# Patient Record
Sex: Female | Born: 2000 | Race: White | Hispanic: No | Marital: Married | Smoking: Never smoker
Health system: Southern US, Community
[De-identification: ages and names within clinical notes are randomized; demographics above are authoritative.]

## PROBLEM LIST (undated history)

## (undated) DIAGNOSIS — G43009 Migraine without aura, not intractable, without status migrainosus: Secondary | ICD-10-CM

## (undated) DIAGNOSIS — R519 Headache, unspecified: Secondary | ICD-10-CM

## (undated) DIAGNOSIS — Z8249 Family history of ischemic heart disease and other diseases of the circulatory system: Secondary | ICD-10-CM

## (undated) DIAGNOSIS — Z87448 Personal history of other diseases of urinary system: Secondary | ICD-10-CM

## (undated) DIAGNOSIS — Z8679 Personal history of other diseases of the circulatory system: Secondary | ICD-10-CM

## (undated) DIAGNOSIS — R251 Tremor, unspecified: Secondary | ICD-10-CM

## (undated) DIAGNOSIS — K831 Obstruction of bile duct: Secondary | ICD-10-CM

## (undated) DIAGNOSIS — Z87898 Personal history of other specified conditions: Secondary | ICD-10-CM

## (undated) DIAGNOSIS — N750 Cyst of Bartholin's gland: Secondary | ICD-10-CM

## (undated) DIAGNOSIS — Z8489 Family history of other specified conditions: Secondary | ICD-10-CM

## (undated) DIAGNOSIS — R51 Headache: Secondary | ICD-10-CM

## (undated) HISTORY — DX: Obstruction of bile duct: K83.1

## (undated) HISTORY — PX: HERNIA REPAIR: SHX51

## (undated) HISTORY — PX: TYMPANOSTOMY TUBE PLACEMENT: SHX32

## (undated) HISTORY — DX: Personal history of other diseases of the circulatory system: Z86.79

## (undated) HISTORY — DX: Family history of ischemic heart disease and other diseases of the circulatory system: Z82.49

## (undated) HISTORY — DX: Family history of other specified conditions: Z84.89

## (undated) HISTORY — DX: Personal history of other specified conditions: Z87.898

## (undated) HISTORY — DX: Migraine without aura, not intractable, without status migrainosus: G43.009

## (undated) HISTORY — DX: Personal history of other diseases of urinary system: Z87.448

## (undated) HISTORY — DX: Cyst of Bartholin's gland: N75.0

## (undated) HISTORY — PX: FOOT SURGERY: SHX648

---
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
## 2019-01-01 ENCOUNTER — Ambulatory Visit (INDEPENDENT_AMBULATORY_CARE_PROVIDER_SITE_OTHER): Payer: BLUE CROSS/BLUE SHIELD | Admitting: Family Medicine

## 2019-01-01 ENCOUNTER — Encounter: Payer: Self-pay | Admitting: Family Medicine

## 2019-01-01 ENCOUNTER — Other Ambulatory Visit: Payer: Self-pay

## 2019-01-01 VITALS — BP 100/62 | HR 86 | Temp 98.2°F | Ht 62.0 in | Wt 108.8 lb

## 2019-01-01 DIAGNOSIS — N760 Acute vaginitis: Secondary | ICD-10-CM | POA: Insufficient documentation

## 2019-01-01 LAB — POCT WET PREP (WET MOUNT): Trichomonas Wet Prep HPF POC: ABSENT

## 2019-01-01 MED ORDER — METRONIDAZOLE 0.75 % VA GEL: 1.0000 | Freq: Every day | VAGINAL | 0 refills | Status: DC

## 2019-01-01 MED ORDER — FLUCONAZOLE 150 MG PO TABS: 150.0000 mg | ORAL_TABLET | Freq: Once | ORAL | 0 refills | Status: AC

## 2019-01-01 NOTE — Assessment & Plan Note (Signed)
Improved after 1 pill diflucan pill but not resolved (this started after taking abx for uti)  Wet prep shows clue cells and scant yeast Will tx with one more diflucan pill orally  For BV-will try metrogel vaginal qhs for 7d  Abstain from sexual activity until treated  Update if not starting to improve in a week or if worsening   Meds ordered this encounter  Medications  . fluconazole (DIFLUCAN) 150 MG tablet    Sig: Take 1 tablet (150 mg total) by mouth once for 1 dose.    Dispense:  1 tablet    Refill:  0  . metroNIDAZOLE (METROGEL VAGINAL) 0.75 % vaginal gel    Sig: Place 1 Applicatorful vaginally at bedtime.    Dispense:  70 g    Refill:  0

## 2019-01-01 NOTE — Patient Instructions (Signed)
Take one more diflucan Use the metro gel vaginal treatment each bedtime for a week  Then please call if no improvement in your symptoms

## 2019-01-01 NOTE — Progress Notes (Signed)
Subjective:    Patient ID: Sydney Dunn, female    DOB: August 20, 2001, 18 y.o.   MRN: [id number]  HPI  Here for vaginal symptoms (after finishing keflex)  White vaginal d/c and itching Dysuria   Had e visit 4/10  For uti and was tx with keflex  Then another e visit on 4/18 for yeast inf-tx with diflucan   For several days after that she was worse -now improved Still has light d/c- still white  Still has vaginal itching  The urinary burning is not there   No otc meds   She had some cramping- but expecting period soon   No concerns for STDs at all   She takes sprintec for contraception  No missed menses   Results for orders placed or performed in visit on 01/01/19  POCT Wet Prep [facility])  Result Value Ref Range   Source Wet Prep POC vag    WBC, Wet Prep HPF POC few    Bacteria Wet Prep HPF POC Few Few   BACTERIA WET PREP MORPHOLOGY POC     Clue Cells Wet Prep HPF POC Moderate (A) None   Clue Cells Wet Prep Whiff POC     Yeast Wet Prep HPF POC Few (A) None   KOH Wet Prep POC None None   Trichomonas Wet Prep HPF POC Absent Absent     Patient Active Problem List   Diagnosis Date Noted  . Vaginitis 01/01/2019  . Routine general medical examination at a health care facility 11/15/2018  . Irregular menses 04/13/2018  . Headache disorder 03/26/2018  . Tremor 06/16/2017  . Family history of headaches 07/13/2016  . Well adolescent visit 03/11/2014  . Hx of hernia repair 03/11/2014   Past Medical History:  Diagnosis Date  . Family history of allergies   . Headache    migraines - 2x/mo.  milder HA more often  . History of headache   . history of heart murmur   . History of hypotension    Past Surgical History:  Procedure Laterality Date  . FOOT SURGERY Left   . HERNIA REPAIR     abdominal hernia 2010 at [facility]  . TYMPANOSTOMY TUBE PLACEMENT     22 months of age   Social History   Tobacco Use  . Smoking status: Never Smoker  . Smokeless tobacco: Never Used   Substance Use Topics  . Alcohol use: No    Alcohol/week: 0.0 standard drinks  . Drug use: No   Family History  Problem Relation Age of Onset  . Breast cancer Other        paternal great grandmother  . Hyperlipidemia Maternal Grandfather   . Heart disease Maternal Grandfather   . Hypertension Maternal Grandfather   . Diabetes Maternal Grandfather   . Hypertension Mother   . Hypertension Father   . Hypertension Maternal Grandmother   . Diabetes Maternal Grandmother   . Kidney disease Other        great grandfather   No Known Allergies Current Outpatient Medications on File Prior to Visit  Medication Sig Dispense Refill  . norgestimate-ethinyl estradiol (SPRINTEC 28) 0.25-35 MG-MCG tablet Take 1 tablet by mouth daily. 3 Package 3  . SUMAtriptan (IMITREX) 25 MG tablet TAKE 1 TABLET BY MOUTH AT ONSET OF HEADACHE MAY TAKE AN ADDITIONAL TABLET EVERY 2 HOURS IF NO RELIEF. MAX OF 3 TABLETS (75MG ) EVERY 24 HOURS 12 tablet 5   No current facility-administered medications on file prior to visit.  Review of Systems  Constitutional: Negative for activity change, appetite change, fatigue, fever and unexpected weight change.  HENT: Negative for congestion, ear pain, rhinorrhea, sinus pressure and sore throat.   Eyes: Negative for pain, redness and visual disturbance.  Respiratory: Negative for cough, shortness of breath and wheezing.   Cardiovascular: Negative for chest pain and palpitations.  Gastrointestinal: Negative for abdominal pain, blood in stool, constipation and diarrhea.  Endocrine: Negative for polydipsia and polyuria.  Genitourinary: Positive for dysuria, vaginal discharge and vaginal pain. Negative for difficulty urinating, dyspareunia, frequency, genital sores, hematuria, pelvic pain and urgency.  Musculoskeletal: Negative for arthralgias, back pain and myalgias.  Skin: Negative for pallor and rash.  Allergic/Immunologic: Negative for environmental allergies.   Neurological: Negative for dizziness, syncope and headaches.  Hematological: Negative for adenopathy. Does not bruise/bleed easily.  Psychiatric/Behavioral: Negative for decreased concentration and dysphoric mood. The patient is not nervous/anxious.        Objective:   Physical Exam Constitutional:      General: She is not in acute distress.    Appearance: Normal appearance. She is normal weight. She is not ill-appearing.     Comments: Well appearing   HENT:     Head: Normocephalic and atraumatic.     Mouth/Throat:     Mouth: Mucous membranes are moist.     Pharynx: Oropharynx is clear.  Eyes:     Extraocular Movements: Extraocular movements intact.     Conjunctiva/sclera: Conjunctivae normal.     Pupils: Pupils are equal, round, and reactive to light.  Neck:     Musculoskeletal: Normal range of motion and neck supple.  Cardiovascular:     Rate and Rhythm: Normal rate.     Heart sounds: Normal heart sounds.  Pulmonary:     Effort: Pulmonary effort is normal. No respiratory distress.     Breath sounds: No wheezing or rales.  Abdominal:     General: Abdomen is flat. Bowel sounds are normal. There is no distension.     Palpations: Abdomen is soft. There is no mass.     Tenderness: There is no abdominal tenderness. There is no guarding or rebound.     Hernia: No hernia is present.     Comments: No suprapubic tenderness or fullness    Genitourinary:    Vagina: Vaginal discharge present.     Comments: Scant white vaginal d/c without odor  slt hyperemia of labia minora No lesions No tenderness   Wet prep pos for clue cells and scant yeast  Musculoskeletal:     Right lower leg: No edema.  Lymphadenopathy:     Cervical: No cervical adenopathy.  Skin:    General: Skin is warm and dry.     Findings: No rash.  Neurological:     Mental Status: She is alert.  Psychiatric:        Mood and Affect: Mood normal.           Assessment & Plan:   Problem List Items Addressed  This Visit      Genitourinary   Vaginitis - Primary    Improved after 1 pill diflucan pill but not resolved (this started after taking abx for uti)  Wet prep shows clue cells and scant yeast Will tx with one more diflucan pill orally  For BV-will try metrogel vaginal qhs for 7d  Abstain from sexual activity until treated  Update if not starting to improve in a week or if worsening   Meds ordered this encounter  Medications  . fluconazole (DIFLUCAN) 150 MG tablet    Sig: Take 1 tablet (150 mg total) by mouth once for 1 dose.    Dispense:  1 tablet    Refill:  0  . metroNIDAZOLE (METROGEL VAGINAL) 0.75 % vaginal gel    Sig: Place 1 Applicatorful vaginally at bedtime.    Dispense:  70 g    Refill:  0         Relevant Orders   POCT Wet Prep [facility]) (Completed)

## 2019-01-23 ENCOUNTER — Ambulatory Visit (INDEPENDENT_AMBULATORY_CARE_PROVIDER_SITE_OTHER): Payer: BLUE CROSS/BLUE SHIELD

## 2019-01-23 DIAGNOSIS — Z111 Encounter for screening for respiratory tuberculosis: Secondary | ICD-10-CM

## 2019-01-23 NOTE — Progress Notes (Signed)
Per orders of Dr. Glori Bickers, injection of Tuberculin skin test given by Lurlean Nanny.  Patient tolerated injection well.

## 2019-01-25 LAB — TB SKIN TEST
Induration: 0 mm
TB Skin Test: NEGATIVE

## 2019-02-19 ENCOUNTER — Ambulatory Visit (INDEPENDENT_AMBULATORY_CARE_PROVIDER_SITE_OTHER): Payer: BC Managed Care – PPO | Admitting: Family Medicine

## 2019-02-19 DIAGNOSIS — R0982 Postnasal drip: Secondary | ICD-10-CM

## 2019-02-19 NOTE — Progress Notes (Signed)
I connected with Sydney Dunn on 02/19/19 at  2:00 PM EDT by video and verified that I am speaking with the correct person using two identifiers.   I discussed the limitations, risks, security and privacy concerns of performing an evaluation and management service by video and the availability of in person appointments. I also discussed with the patient that there may be a patient responsible charge related to this service. The patient expressed understanding and agreed to proceed.  Patient location: Home Provider Location: [facility] Participants: Sydney Dunn and Sydney Dunn   Subjective:     Sydney Dunn is a 18 y.o. female presenting for Sore Throat (symptoms started around 02/15/2019. Painful to swallow-better today, throat redness/swelling is better today. Had temp of around 99.8 last week. Has not taking any medications for symptoms. No more white spots in her throat. No ear pain, no headache. Has had some post nasal drip and runny nose.)     Sore Throat   This is a new problem. The current episode started in the past 7 days. The problem has been gradually improving. There has been no fever. The pain is moderate. Associated symptoms include congestion, swollen glands and trouble swallowing. Pertinent negatives include no coughing, diarrhea, ear pain, headaches, neck pain, shortness of breath or vomiting. She has had no exposure to strep or mono. She has tried nothing for the symptoms. The treatment provided no relief.     Review of Systems  Constitutional: Negative for chills and fever.  HENT: Positive for congestion, postnasal drip, rhinorrhea, sneezing and trouble swallowing. Negative for ear pain.   Eyes: Negative for itching.  Respiratory: Negative for cough and shortness of breath.   Gastrointestinal: Negative for diarrhea and vomiting.  Musculoskeletal: Negative for arthralgias, myalgias and neck pain.  Neurological: Negative for headaches.     Social History   Tobacco Use  Smoking Status Never Smoker  Smokeless Tobacco Never Used        Objective:   BP Readings from Last 3 Encounters:  01/01/19 100/62  11/22/18 (!) 104/56  11/14/18 112/72   Wt Readings from Last 3 Encounters:  01/01/19 108 lb 12.8 oz (49.4 kg) (15 %, Z= -1.03)*  11/22/18 105 lb 8 oz (47.9 kg) (10 %, Z= -1.27)*  11/14/18 111 lb (50.3 kg) (20 %, Z= -0.85)*   * Growth percentiles are based on CDC (Girls, 2-20 Years) data.    Unable to take VS  Physical Exam Constitutional:      Appearance: Normal appearance. She is not ill-appearing.  HENT:     Head: Normocephalic and atraumatic.     Right Ear: External ear normal.     Left Ear: External ear normal.     Mouth/Throat:     Mouth: Mucous membranes are moist.     Pharynx: Posterior oropharyngeal erythema present. No pharyngeal swelling or oropharyngeal exudate.     Tonsils: No tonsillar exudate. 1+ on the right. 1+ on the left.  Eyes:     Conjunctiva/sclera: Conjunctivae normal.  Pulmonary:     Effort: Pulmonary effort is normal. No respiratory distress.  Neurological:     Mental Status: She is alert. Mental status is at baseline.  Psychiatric:        Mood and Affect: Mood normal.        Behavior: Behavior normal.        Thought Content: Thought content normal.        Judgment: Judgment normal.  Assessment & Plan:   Problem List Items Addressed This Visit    None    Visit Diagnoses    Post-nasal drip    -  Primary     As patient is having nasal discharge and post-nasal drip and throat with mild erythema w/o fevers suspect allergies and post-nasal drip causing the sore throat.   Advised allergy medication. Call back if developing fevers or worsening symtpoms  Return if symptoms worsen or fail to improve.  Sydney Noe, MD

## 2019-03-01 NOTE — Progress Notes (Signed)
Greater than 5 minutes, yet less than 10 minutes of time have been spent researching, coordinating, and implementing care for this patient today.  Thank you for the details you included in the comment boxes. Those details are very helpful in determining the best course of treatment for you and help us to provide the best care.  

## 2019-04-01 ENCOUNTER — Telehealth: Payer: Self-pay | Admitting: Family Medicine

## 2019-04-01 DIAGNOSIS — Z20822 Contact with and (suspected) exposure to covid-19: Secondary | ICD-10-CM | POA: Insufficient documentation

## 2019-04-01 DIAGNOSIS — Z20828 Contact with and (suspected) exposure to other viral communicable diseases: Secondary | ICD-10-CM

## 2019-04-01 NOTE — Telephone Encounter (Signed)
Patient's mother called and said patient was exposed to Covid.  Patient's sister has Covid.  Patient isn't having any symptoms. She'd like to be tested today.

## 2019-04-01 NOTE — Telephone Encounter (Signed)
Pt advised Covid-19 test done and address to testing site given

## 2019-04-05 LAB — NOVEL CORONAVIRUS, NAA: SARS-CoV-2, NAA: NOT DETECTED

## 2019-06-04 ENCOUNTER — Encounter: Payer: Self-pay | Admitting: Obstetrics and Gynecology

## 2019-06-07 ENCOUNTER — Telehealth: Payer: Self-pay

## 2019-06-07 NOTE — Telephone Encounter (Signed)
Larene Beach said pt needs copy of immunization record. Copied [facility] and placed at front desk for pick up. Larene Beach voiced understanding.

## 2019-06-11 ENCOUNTER — Other Ambulatory Visit: Payer: Self-pay | Admitting: Family Medicine

## 2019-06-11 ENCOUNTER — Ambulatory Visit: Payer: BC Managed Care – PPO

## 2019-06-11 NOTE — Telephone Encounter (Signed)
CPE was on 11/22/18, last filled on 06/16/17 #12 tabs with 5 refills

## 2019-07-04 ENCOUNTER — Other Ambulatory Visit: Payer: Self-pay

## 2019-07-04 ENCOUNTER — Ambulatory Visit (INDEPENDENT_AMBULATORY_CARE_PROVIDER_SITE_OTHER): Payer: BC Managed Care – PPO | Admitting: Obstetrics and Gynecology

## 2019-07-04 ENCOUNTER — Encounter: Payer: Self-pay | Admitting: Obstetrics and Gynecology

## 2019-07-04 ENCOUNTER — Telehealth: Payer: Self-pay | Admitting: *Deleted

## 2019-07-04 VITALS — BP 120/82 | HR 64 | Temp 97.2°F | Ht 62.0 in | Wt 107.0 lb

## 2019-07-04 DIAGNOSIS — N6325 Unspecified lump in the left breast, overlapping quadrants: Secondary | ICD-10-CM

## 2019-07-04 DIAGNOSIS — N898 Other specified noninflammatory disorders of vagina: Secondary | ICD-10-CM

## 2019-07-04 DIAGNOSIS — Z833 Family history of diabetes mellitus: Secondary | ICD-10-CM

## 2019-07-04 DIAGNOSIS — Z113 Encounter for screening for infections with a predominantly sexual mode of transmission: Secondary | ICD-10-CM | POA: Diagnosis not present

## 2019-07-04 DIAGNOSIS — Z3041 Encounter for surveillance of contraceptive pills: Secondary | ICD-10-CM | POA: Diagnosis not present

## 2019-07-04 DIAGNOSIS — Z01419 Encounter for gynecological examination (general) (routine) without abnormal findings: Secondary | ICD-10-CM | POA: Diagnosis not present

## 2019-07-04 DIAGNOSIS — B3731 Acute candidiasis of vulva and vagina: Secondary | ICD-10-CM

## 2019-07-04 DIAGNOSIS — Z0000 Encounter for general adult medical examination without abnormal findings: Secondary | ICD-10-CM

## 2019-07-04 DIAGNOSIS — B373 Candidiasis of vulva and vagina: Secondary | ICD-10-CM

## 2019-07-04 MED ORDER — NORGESTIMATE-ETH ESTRADIOL 0.25-35 MG-MCG PO TABS: 1.0000 | ORAL_TABLET | Freq: Every day | ORAL | 3 refills | Status: DC

## 2019-07-04 MED ORDER — FLUCONAZOLE 150 MG PO TABS: 150.0000 mg | ORAL_TABLET | Freq: Once | ORAL | 0 refills | Status: AC

## 2019-07-04 NOTE — Telephone Encounter (Signed)
-----   Message from Salvadore Dom, MD sent at 07/04/2019 10:34 AM EDT ----- Please set her up for a left breast ultrasound. Pea sized lump at 5-6 o'clock at the periphery. Thanks, Sharee Pimple
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
## 2019-07-04 NOTE — Telephone Encounter (Signed)
Spoke with [name] at [facility]. Left breast US scheduled for 07/11/19 at [facility], arrive at 7:20am, 7:40am.   Spoke with patient, notified of appt. Patient is agreeable to date and time.   Routing to provider for final review. Patient is agreeable to disposition. Will close encounter.

## 2019-07-04 NOTE — Patient Instructions (Addendum)
EXERCISE AND DIET:  We recommended that you start or continue a regular exercise program for good health. Regular exercise means any activity that makes your heart beat faster and makes you sweat.  We recommend exercising at least 30 minutes per day at least 3 days a week, preferably 4 or 5.  We also recommend a diet low in fat and sugar.  Inactivity, poor dietary choices and obesity can cause diabetes, heart attack, stroke, and kidney damage, among others.    ALCOHOL AND SMOKING:  Women should limit their alcohol intake to no more than 7 drinks/beers/glasses of wine (combined, not each!) per week. Moderation of alcohol intake to this level decreases your risk of breast cancer and liver damage. And of course, no recreational drugs are part of a healthy lifestyle.  And absolutely no smoking or even second hand smoke. Most people know smoking can cause heart and lung diseases, but did you know it also contributes to weakening of your bones? Aging of your skin?  Yellowing of your teeth and nails?  CALCIUM AND VITAMIN D:  Adequate intake of calcium and Vitamin D are recommended.  The recommendations for exact amounts of these supplements seem to change often, but generally speaking 1,000 mg of calcium (between diet and supplement) and 800 units of Vitamin D per day seems prudent. Certain women may benefit from higher intake of Vitamin D.  If you are among these women, your doctor will have told you during your visit.    PAP SMEARS:  Pap smears, to check for cervical cancer or precancers,  have traditionally been done yearly, although recent scientific advances have shown that most women can have pap smears less often.  However, every woman still should have a physical exam from her gynecologist every year. It will include a breast check, inspection of the vulva and vagina to check for abnormal growths or skin changes, a visual exam of the cervix, and then an exam to evaluate the size and shape of the uterus and  ovaries.  And after 19 years of age, a rectal exam is indicated to check for rectal cancers. We will also provide age appropriate advice regarding health maintenance, like when you should have certain vaccines, screening for sexually transmitted diseases, bone density testing, colonoscopy, mammograms, etc.   MAMMOGRAMS:  All women over 75 years old should have a yearly mammogram. Many facilities now offer a "3D" mammogram, which may cost around $50 extra out of pocket. If possible,  we recommend you accept the option to have the 3D mammogram performed.  It both reduces the number of women who will be called back for extra views which then turn out to be normal, and it is better than the routine mammogram at detecting truly abnormal areas.    COLON CANCER SCREENING: Now recommend starting at age 57. At this time colonoscopy is not covered for routine screening until 50. There are take home tests that can be done between 45-49.   COLONOSCOPY:  Colonoscopy to screen for colon cancer is recommended for all women at age 91.  We know, you hate the idea of the prep.  We agree, BUT, having colon cancer and not knowing it is worse!!  Colon cancer so often starts as a polyp that can be seen and removed at colonscopy, which can quite literally save your life!  And if your first colonoscopy is normal and you have no family history of colon cancer, most women don't have to have it again for  10 years.  Once every ten years, you can do something that may end up saving your life, right?  We will be happy to help you get it scheduled when you are ready.  Be sure to check your insurance coverage so you understand how much it will cost.  It may be covered as a preventative service at no cost, but you should check your particular policy.   ° ° ° °Breast Self-Awareness °Breast self-awareness means being familiar with how your breasts look and feel. It involves checking your breasts regularly and reporting any changes to your  health care provider. °Practicing breast self-awareness is important. A change in your breasts can be a sign of a serious medical problem. Being familiar with how your breasts look and feel allows you to find any problems early, when treatment is more likely to be successful. All women should practice breast self-awareness, including women who have had breast implants. °How to do a breast self-exam °One way to learn what is normal for your breasts and whether your breasts are changing is to do a breast self-exam. To do a breast self-exam: °Look for Changes ° °1. Remove all the clothing above your waist. °2. Stand in front of a mirror in a room with good lighting. °3. Put your hands on your hips. °4. Push your hands firmly downward. °5. Compare your breasts in the mirror. Look for differences between them (asymmetry), such as: °? Differences in shape. °? Differences in size. °? Puckers, dips, and bumps in one breast and not the other. °6. Look at each breast for changes in your skin, such as: °? Redness. °? Scaly areas. °7. Look for changes in your nipples, such as: °? Discharge. °? Bleeding. °? Dimpling. °? Redness. °? A change in position. °Feel for Changes °Carefully feel your breasts for lumps and changes. It is best to do this while lying on your back on the floor and again while sitting or standing in the shower or tub with soapy water on your skin. Feel each breast in the following way: °· Place the arm on the side of the breast you are examining above your head. °· Feel your breast with the other hand. °· Start in the nipple area and make ¾ inch (2 cm) overlapping circles to feel your breast. Use the pads of your three middle fingers to do this. Apply light pressure, then medium pressure, then firm pressure. The light pressure will allow you to feel the tissue closest to the skin. The medium pressure will allow you to feel the tissue that is a little deeper. The firm pressure will allow you to feel the tissue  close to the ribs. °· Continue the overlapping circles, moving downward over the breast until you feel your ribs below your breast. °· Move one finger-width toward the center of the body. Continue to use the ¾ inch (2 cm) overlapping circles to feel your breast as you move slowly up toward your collarbone. °· Continue the up and down exam using all three pressures until you reach your armpit. ° °Write Down What You Find ° °Write down what is normal for each breast and any changes that you find. Keep a written record with breast changes or normal findings for each breast. By writing this information down, you do not need to depend only on memory for size, tenderness, or location. Write down where you are in your menstrual cycle, if you are still menstruating. °If you are having trouble noticing differences   in your breasts, do not get discouraged. With time you will become more familiar with the variations in your breasts and more comfortable with the exam. How often should I examine my breasts? Examine your breasts every month. If you are breastfeeding, the best time to examine your breasts is after a feeding or after using a breast pump. If you menstruate, the best time to examine your breasts is 5-7 days after your period is over. During your period, your breasts are lumpier, and it may be more difficult to notice changes. When should I see my health care provider? See your health care provider if you notice:  A change in shape or size of your breasts or nipples.  A change in the skin of your breast or nipples, such as a reddened or scaly area.  Unusual discharge from your nipples.  A lump or thick area that was not there before.  Pain in your breasts.  Anything that concerns you.   Vaginal Yeast infection, Adult  Vaginal yeast infection is a condition that causes vaginal discharge as well as soreness, swelling, and redness (inflammation) of the vagina. This is a common condition. Some women  get this infection frequently. What are the causes? This condition is caused by a change in the normal balance of the yeast (candida) and bacteria that live in the vagina. This change causes an overgrowth of yeast, which causes the inflammation. What increases the risk? The condition is more likely to develop in women who:  Take antibiotic medicines.  Have diabetes.  Take birth control pills.  Are pregnant.  Douche often.  Have a weak body defense system (immune system).  Have been taking steroid medicines for a long time.  Frequently wear tight clothing. What are the signs or symptoms? Symptoms of this condition include:  White, thick, creamy vaginal discharge.  Swelling, itching, redness, and irritation of the vagina. The lips of the vagina (vulva) may be affected as well.  Pain or a burning feeling while urinating.  Pain during sex. How is this diagnosed? This condition is diagnosed based on:  Your medical history.  A physical exam.  A pelvic exam. Your health care provider will examine a sample of your vaginal discharge under a microscope. Your health care provider may send this sample for testing to confirm the diagnosis. How is this treated? This condition is treated with medicine. Medicines may be over-the-counter or prescription. You may be told to use one or more of the following:  Medicine that is taken by mouth (orally).  Medicine that is applied as a cream (topically).  Medicine that is inserted directly into the vagina (suppository). Follow these instructions at home:  Lifestyle  Do not have sex until your health care provider approves. Tell your sex partner that you have a yeast infection. That person should go to his or her health care provider and ask if they should also be treated.  Do not wear tight clothes, such as pantyhose or tight pants.  Wear breathable cotton underwear. General instructions  Take or apply over-the-counter and  prescription medicines only as told by your health care provider.  Eat more yogurt. This may help to keep your yeast infection from returning.  Do not use tampons until your health care provider approves.  Try taking a sitz bath to help with discomfort. This is a warm water bath that is taken while you are sitting down. The water should only come up to your hips and should cover your buttocks.  Do this 3-4 times per day or as told by your health care provider.  Do not douche.  If you have diabetes, keep your blood sugar levels under control.  Keep all follow-up visits as told by your health care provider. This is important. Contact a health care provider if:  You have a fever.  Your symptoms go away and then return.  Your symptoms do not get better with treatment.  Your symptoms get worse.  You have new symptoms.  You develop blisters in or around your vagina.  You have blood coming from your vagina and it is not your menstrual period.  You develop pain in your abdomen. Summary  Vaginal yeast infection is a condition that causes discharge as well as soreness, swelling, and redness (inflammation) of the vagina.  This condition is treated with medicine. Medicines may be over-the-counter or prescription.  Take or apply over-the-counter and prescription medicines only as told by your health care provider.  Do not douche. Do not have sex or use tampons until your health care provider approves.  Contact a health care provider if your symptoms do not get better with treatment or your symptoms go away and then return. This information is not intended to replace advice given to you by your health care provider. Make sure you discuss any questions you have with your health care provider. Document Released: 06/08/2005 Document Revised: 01/15/2018 Document Reviewed: 01/15/2018 Elsevier Patient Education  [place].

## 2019-07-05 LAB — COMPREHENSIVE METABOLIC PANEL
ALT: 6 IU/L (ref 0–32)
AST: 11 IU/L (ref 0–40)
Albumin/Globulin Ratio: 1.8 (ref 1.2–2.2)
Albumin: 4.4 g/dL (ref 3.9–5.0)
Alkaline Phosphatase: 60 IU/L (ref 39–117)
BUN/Creatinine Ratio: 13 (ref 9–23)
BUN: 8 mg/dL (ref 6–20)
Bilirubin Total: 0.3 mg/dL (ref 0.0–1.2)
CO2: 25 mmol/L (ref 20–29)
Calcium: 9.5 mg/dL (ref 8.7–10.2)
Chloride: 104 mmol/L (ref 96–106)
Creatinine, Ser: 0.64 mg/dL (ref 0.57–1.00)
GFR calc Af Amer: 150 mL/min/{1.73_m2} (ref >59)
GFR calc non Af Amer: 130 mL/min/{1.73_m2} (ref >59)
Globulin, Total: 2.5 g/dL (ref 1.5–4.5)
Glucose: 89 mg/dL (ref 65–99)
Potassium: 4.5 mmol/L (ref 3.5–5.2)
Sodium: 139 mmol/L (ref 134–144)
Total Protein: 6.9 g/dL (ref 6.0–8.5)

## 2019-07-05 LAB — CBC
Hematocrit: 38.5 % (ref 34.0–46.6)
Hemoglobin: 12.9 g/dL (ref 11.1–15.9)
MCH: 31.2 pg (ref 26.6–33.0)
MCHC: 33.5 g/dL (ref 31.5–35.7)
MCV: 93 fL (ref 79–97)
Platelets: 249 10*3/uL (ref 150–450)
RBC: 4.13 x10E6/uL (ref 3.77–5.28)
RDW: 11.6 % — ABNORMAL LOW (ref 11.7–15.4)
WBC: 8.7 10*3/uL (ref 3.4–10.8)

## 2019-07-05 LAB — GC/CHLAMYDIA PROBE AMP
Chlamydia trachomatis, NAA: NEGATIVE
Neisseria Gonorrhoeae by PCR: NEGATIVE

## 2019-07-05 LAB — HEP, RPR, HIV PANEL
HIV Screen 4th Generation wRfx: NONREACTIVE
Hepatitis B Surface Ag: NEGATIVE
RPR Ser Ql: NONREACTIVE

## 2019-07-05 LAB — LIPID PANEL
Chol/HDL Ratio: 3.3 ratio (ref 0.0–4.4)
Cholesterol, Total: 206 mg/dL — ABNORMAL HIGH (ref 100–169)
HDL: 63 mg/dL (ref >39)
LDL Chol Calc (NIH): 113 mg/dL — ABNORMAL HIGH (ref 0–109)
Triglycerides: 176 mg/dL — ABNORMAL HIGH (ref 0–89)
VLDL Cholesterol Cal: 30 mg/dL (ref 5–40)

## 2019-07-05 LAB — HEPATITIS C ANTIBODY: Hep C Virus Ab: <0.1 s/co ratio (ref 0.0–0.9)

## 2019-07-05 LAB — VAGINITIS/VAGINOSIS, DNA PROBE
Candida Species: POSITIVE — AB
Gardnerella vaginalis: NEGATIVE
Trichomonas vaginosis: NEGATIVE

## 2019-07-05 LAB — HEMOGLOBIN A1C
Est. average glucose Bld gHb Est-mCnc: 105 mg/dL
Hgb A1c MFr Bld: 5.3 % (ref 4.8–5.6)

## 2019-07-11 ENCOUNTER — Other Ambulatory Visit: Payer: Self-pay | Admitting: Obstetrics and Gynecology

## 2019-07-11 ENCOUNTER — Other Ambulatory Visit: Payer: Self-pay

## 2019-07-11 ENCOUNTER — Ambulatory Visit
Admission: RE | Admit: 2019-07-11 | Discharge: 2019-07-11 | Disposition: A | Discharge status: Home / Self Care | Payer: BC Managed Care – PPO | Source: Ambulatory Visit | Attending: Obstetrics and Gynecology | Admitting: Obstetrics and Gynecology

## 2019-07-11 DIAGNOSIS — N6325 Unspecified lump in the left breast, overlapping quadrants: Secondary | ICD-10-CM

## 2019-07-11 DIAGNOSIS — N6320 Unspecified lump in the left breast, unspecified quadrant: Secondary | ICD-10-CM

## 2019-08-22 ENCOUNTER — Encounter: Payer: Self-pay | Admitting: Obstetrics and Gynecology

## 2019-08-22 ENCOUNTER — Telehealth: Payer: Self-pay | Admitting: Obstetrics and Gynecology

## 2019-08-22 NOTE — Telephone Encounter (Signed)
Patient sent the following message through [facility]. Routing to triage to assist patient with request.  Erle Crocker [facility]  Phone Number: [phone]  Good morning,  I saw you not to long ago and you gave me medicine to help with a yeast infection. We talked about how it gets bad around my period and it comes back over and over. It's come back again. Is there anything I could take, something to help this go away and make it stop coming back all the time?

## 2019-08-22 NOTE — Telephone Encounter (Signed)
Spoke with patient. Patient reports white, thick to runny, vaginal d/c with slight odor and cloudy urine. Symptoms started after menses ended this past weekend. Denies pelvic pain, fever/chills, dysuria, flank pain. Tx for yeast on 07/04/19.    [id number] prescreen negative, precautions reviewed. OV scheduled for 12/11 at 8am with Dr. Talbert Nan.   Routing to provider for final review. Patient is agreeable to disposition. Will close encounter.

## 2019-08-23 ENCOUNTER — Encounter: Payer: Self-pay | Admitting: Obstetrics and Gynecology

## 2019-08-23 ENCOUNTER — Ambulatory Visit: Payer: Self-pay | Admitting: Obstetrics and Gynecology

## 2019-09-30 ENCOUNTER — Telehealth: Payer: Self-pay

## 2019-09-30 NOTE — Telephone Encounter (Signed)
Pt left v/m that she needs immunization record. Pt request cb.

## 2019-09-30 NOTE — Telephone Encounter (Signed)
Pt notified copy ready for pick up

## 2019-10-03 ENCOUNTER — Ambulatory Visit (INDEPENDENT_AMBULATORY_CARE_PROVIDER_SITE_OTHER): Payer: BC Managed Care – PPO | Admitting: *Deleted

## 2019-10-03 DIAGNOSIS — Z23 Encounter for immunization: Secondary | ICD-10-CM

## 2019-10-09 ENCOUNTER — Other Ambulatory Visit: Payer: Self-pay

## 2019-10-10 ENCOUNTER — Ambulatory Visit: Payer: Self-pay | Admitting: Obstetrics and Gynecology

## 2019-10-10 ENCOUNTER — Telehealth: Payer: Self-pay | Admitting: Obstetrics and Gynecology
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
## 2019-10-10 NOTE — Progress Notes (Deleted)
GYNECOLOGY  VISIT   HPI: 19 y.o.   Single White or Caucasian Not Hispanic or Latino  female   G0P0000 with No LMP recorded.   here for     GYNECOLOGIC HISTORY: No LMP recorded. Contraception:OCP Menopausal hormone therapy: NONE        OB History    Gravida  0   Para  0   Term  0   Preterm  0   AB  0   Living  0     SAB  0   TAB  0   Ectopic  0   Multiple  0   Live Births  0              Patient Active Problem List   Diagnosis Date Noted  . Close exposure to COVID-19 virus 04/01/2019  . Vaginitis 01/01/2019  . Routine general medical examination at a health care facility 11/15/2018  . Irregular menses 04/13/2018  . Headache disorder 03/26/2018  . Tremor 06/16/2017  . Family history of headaches 07/13/2016  . Well adolescent visit 03/11/2014  . Hx of hernia repair 03/11/2014    Past Medical History:  Diagnosis Date  . Family history of allergies   . Headache    migraines - 2x/mo.  milder HA more often  . History of headache   . history of heart murmur   . History of hypotension   . Migraine without aura     Past Surgical History:  Procedure Laterality Date  . FOOT SURGERY Left   . HERNIA REPAIR     abdominal hernia 2010 at [facility]  . TYMPANOSTOMY TUBE PLACEMENT     100 months of age    Current Outpatient Medications  Medication Sig Dispense Refill  . norgestimate-ethinyl estradiol (SPRINTEC 28) 0.25-35 MG-MCG tablet Take 1 tablet by mouth daily. 3 Package 3  . SUMAtriptan (IMITREX) 25 MG tablet TAKE 1 TABLET BY MOUTH AT ONSET OF HEADACHE MAY TAKE AN ADDITONAL TABLET EVERY 2 HOURS IF NO RELEIF MAX OF 3 TABS IN 24 HOURS 12 tablet 1   No current facility-administered medications for this visit.     ALLERGIES: Patient has no known allergies.  Family History  Problem Relation Age of Onset  . Breast cancer Other        paternal great grandmother  . Hyperlipidemia Maternal Grandfather   . Heart disease Maternal Grandfather   . Hypertension  Maternal Grandfather   . Diabetes Maternal Grandfather   . Hypertension Mother   . Hypertension Father   . Diabetes Father   . Hypertension Maternal Grandmother   . Diabetes Maternal Grandmother   . Kidney disease Other        great grandfather    Social History   Socioeconomic History  . Marital status: Single    Spouse name: Not on file  . Number of children: Not on file  . Years of education: Not on file  . Highest education level: Not on file  Occupational History  . Not on file  Tobacco Use  . Smoking status: Never Smoker  . Smokeless tobacco: Never Used  Substance and Sexual Activity  . Alcohol use: No    Alcohol/week: 0.0 standard drinks  . Drug use: No  . Sexual activity: Yes    Birth control/protection: Pill  Other Topics Concern  . Not on file  Social History Narrative   Softball and basketball   [place]   Social Determinants of Health  Financial Resource Strain:   . Difficulty of Paying Living Expenses: Not on file  Food Insecurity:   . Worried About Charity fundraiser in the Last Year: Not on file  . Ran Out of Food in the Last Year: Not on file  Transportation Needs:   . Lack of Transportation (Medical): Not on file  . Lack of Transportation (Non-Medical): Not on file  Physical Activity:   . Days of Exercise per Week: Not on file  . Minutes of Exercise per Session: Not on file  Stress:   . Feeling of Stress : Not on file  Social Connections:   . Frequency of Communication with Friends and Family: Not on file  . Frequency of Social Gatherings with Friends and Family: Not on file  . Attends Religious Services: Not on file  . Active Member of Clubs or Organizations: Not on file  . Attends Archivist Meetings: Not on file  . Marital Status: Not on file  Intimate Partner Violence:   . Fear of Current or Ex-Partner: Not on file  . Emotionally Abused: Not on file  . Physically Abused: Not on file  . Sexually  Abused: Not on file    ROS  PHYSICAL EXAMINATION:    There were no vitals taken for this visit.    General appearance: alert, cooperative and appears stated age Neck: no adenopathy, supple, symmetrical, trachea midline and thyroid {CHL AMB PHY EX THYROID NORM DEFAULT:[phone]::"normal to inspection and palpation"} Breasts: {Exam; breast:13139::"normal appearance, no masses or tenderness"} Abdomen: soft, non-tender; non distended, no masses,  no organomegaly  Pelvic: External genitalia:  no lesions              Urethra:  normal appearing urethra with no masses, tenderness or lesions              Bartholins and Skenes: normal                 Vagina: normal appearing vagina with normal color and discharge, no lesions              Cervix: {CHL AMB PHY EX CERVIX NORM DEFAULT:[phone]::"no lesions"}              Bimanual Exam:  Uterus:  {CHL AMB PHY EX UTERUS NORM DEFAULT:[id number]::"normal size, contour, position, consistency, mobility, non-tender"}              Adnexa: {CHL AMB PHY EX ADNEXA NO MASS DEFAULT:[id number]::"no mass, fullness, tenderness"}              Rectovaginal: {yes no:[id number]}.  Confirms.              Anus:  normal sphincter tone, no lesions  Chaperone was present for exam.  ASSESSMENT     PLAN    An After Visit Summary was printed and given to the patient.  *** minutes face to face time of which over 50% was spent in counseling.

## 2019-10-30 NOTE — Progress Notes (Signed)
19 y.o. Single Caucasian female G0P0000 here with complaint of vaginal symptoms of increase in discharge with some increase odor and white discharge.Denies douching. Use Dove body wash and Honey Bee product for women.. Denies vaginal  itching, burning. Finished period one week ago and noticed smell again. Onset of symptoms 3-4 days ago. Denies new personal products. No STD concerns, no partner change, using condoms for STD prevention.. Urinary symptoms none. Contraception is oral contraception, no missed pills. Denies any breast issues today. No other health issues today.  Review of Systems  Constitutional: Negative.   HENT: Negative.   Eyes: Negative.   Respiratory: Negative.   Cardiovascular: Negative.   Gastrointestinal: Negative.   Genitourinary: Negative.   Musculoskeletal: Negative.   Skin:       Vaginal discharge with odor  Neurological: Negative.   Endo/Heme/Allergies: Negative.   Psychiatric/Behavioral: Negative.     O:Healthy female WDWN Affect: normal, orientation x 3  Exam:Skin: warm and dry Abdomen:Soft,non tender, no masses  Inguinal Lymph nodes no enlargement or tenderness Pelvic exam: External genital: normal female,no lesions or redness, recent shaving noted in vulva area BUS: negative Vagina: white non odorous normal appearing discharge noted.  Affirm taken Cervix: normal, non tender, no CMT Uterus: normal, non tender Adnexa:normal, non tender, no masses or fullness noted Rectal area: normal appearance   A:Vaginal odor R/O vaginal infection Contraception: oral contraception   P:Discussed findings of normal appearing discharge and normal pelvic exam Discussed discharge changes with ovulation, pre and post menstrual. Also may note change after sexual activity and etiology. Discussed  Baking soda sitz bath for control of odor/comfort if needed.  Avoid wearing tampons for extended period of time. If working out in gym clothes  for long periods of time change  underwear. Questions answered at length regarding expectations as age changes. Lab: affirm will treat if indicated.  Rv prn  12 minutes of time spent in addition to exam discussing normal vaginal discharge expectations with menstrual cycle.

## 2019-11-01 ENCOUNTER — Other Ambulatory Visit: Payer: Self-pay

## 2019-11-01 ENCOUNTER — Ambulatory Visit (INDEPENDENT_AMBULATORY_CARE_PROVIDER_SITE_OTHER): Payer: BC Managed Care – PPO | Admitting: Certified Nurse Midwife

## 2019-11-01 ENCOUNTER — Ambulatory Visit: Payer: BC Managed Care – PPO | Admitting: Family Medicine

## 2019-11-01 ENCOUNTER — Encounter: Payer: Self-pay | Admitting: Certified Nurse Midwife

## 2019-11-01 VITALS — BP 110/64 | HR 64 | Temp 98.2°F | Resp 16 | Wt 108.0 lb

## 2019-11-01 DIAGNOSIS — N898 Other specified noninflammatory disorders of vagina: Secondary | ICD-10-CM | POA: Diagnosis not present

## 2019-11-01 NOTE — Patient Instructions (Signed)
Vaginitis Vaginitis is a condition in which the vaginal tissue swells and becomes red (inflamed). This condition is most often caused by a change in the normal balance of bacteria and yeast that live in the vagina. This change causes an overgrowth of certain bacteria or yeast, which causes the inflammation. There are different types of vaginitis, but the most common types are:  Bacterial vaginosis.  Yeast infection (candidiasis).  Trichomoniasis vaginitis. This is a sexually transmitted disease (STD).  Viral vaginitis.  Atrophic vaginitis.  Allergic vaginitis. What are the causes? The cause of this condition depends on the type of vaginitis. It can be caused by:  Bacteria (bacterial vaginosis).  Yeast, which is a fungus (yeast infection).  A parasite (trichomoniasis vaginitis).  A virus (viral vaginitis).  Low hormone levels (atrophic vaginitis). Low hormone levels can occur during pregnancy, breastfeeding, or after menopause.  Irritants, such as bubble baths, scented tampons, and feminine sprays (allergic vaginitis). Other factors can change the normal balance of the yeast and bacteria that live in the vagina. These include:  Antibiotic medicines.  Poor hygiene.  Diaphragms, vaginal sponges, spermicides, birth control pills, and intrauterine devices (IUD).  Sex.  Infection.  Uncontrolled diabetes.  A weakened defense (immune) system. What increases the risk? This condition is more likely to develop in women who:  Smoke.  Use vaginal douches, scented tampons, or scented sanitary pads.  Wear tight-fitting pants.  Wear thong underwear.  Use oral birth control pills or an IUD.  Have sex without a condom.  Have multiple sex partners.  Have an STD.  Frequently use the spermicide nonoxynol-9.  Eat lots of foods high in sugar.  Have uncontrolled diabetes.  Have low estrogen levels.  Have a weakened immune system from an immune disorder or medical  treatment.  Are pregnant or breastfeeding. What are the signs or symptoms? Symptoms vary depending on the cause of the vaginitis. Common symptoms include:  Abnormal vaginal discharge. ? The discharge is white, gray, or yellow with bacterial vaginosis. ? The discharge is thick, white, and cheesy with a yeast infection. ? The discharge is frothy and yellow or greenish with trichomoniasis.  A bad vaginal smell. The smell is fishy with bacterial vaginosis.  Vaginal itching, pain, or swelling.  Sex that is painful.  Pain or burning when urinating. Sometimes there are no symptoms. How is this diagnosed? This condition is diagnosed based on your symptoms and medical history. A physical exam, including a pelvic exam, will also be done. You may also have other tests, including:  Tests to determine the pH level (acidity or alkalinity) of your vagina.  A whiff test, to assess the odor that results when a sample of your vaginal discharge is mixed with a potassium hydroxide solution.  Tests of vaginal fluid. A sample will be examined under a microscope. How is this treated? Treatment varies depending on the type of vaginitis you have. Your treatment may include:  Antibiotic creams or pills to treat bacterial vaginosis and trichomoniasis.  Antifungal medicines, such as vaginal creams or suppositories, to treat a yeast infection.  Medicine to ease discomfort if you have viral vaginitis. Your sexual partner should also be treated.  Estrogen delivered in a cream, pill, suppository, or vaginal ring to treat atrophic vaginitis. If vaginal dryness occurs, lubricants and moisturizing creams may help. You may need to avoid scented soaps, sprays, or douches.  Stopping use of a product that is causing allergic vaginitis. Then using a vaginal cream to treat the symptoms. Follow   these instructions at home: Lifestyle  Keep your genital area clean and dry. Avoid soap, and only rinse the area with  water.  Do not douche or use tampons until your health care provider says it is okay to do so. Use sanitary pads, if needed.  Do not have sex until your health care provider approves. When you can return to sex, practice safe sex and use condoms.  Wipe from front to back. This avoids the spread of bacteria from the rectum to the vagina. General instructions  Take over-the-counter and prescription medicines only as told by your health care provider.  If you were prescribed an antibiotic medicine, take or use it as told by your health care provider. Do not stop taking or using the antibiotic even if you start to feel better.  Keep all follow-up visits as told by your health care provider. This is important. How is this prevented?  Use mild, non-scented products. Do not use things that can irritate the vagina, such as fabric softeners. Avoid the following products if they are scented: ? Feminine sprays. ? Detergents. ? Tampons. ? Feminine hygiene products. ? Soaps or bubble baths.  Let air reach your genital area. ? Wear cotton underwear to reduce moisture buildup. ? Avoid wearing underwear while you sleep. ? Avoid wearing tight pants and underwear or nylons without a cotton panel. ? Avoid wearing thong underwear.  Take off any wet clothing, such as bathing suits, as soon as possible.  Practice safe sex and use condoms. Contact a health care provider if:  You have abdominal pain.  You have a fever.  You have symptoms that last for more than 2-3 days. Get help right away if:  You have a fever and your symptoms suddenly get worse. Summary  Vaginitis is a condition in which the vaginal tissue becomes inflamed.This condition is most often caused by a change in the normal balance of bacteria and yeast that live in the vagina.  Treatment varies depending on the type of vaginitis you have.  Do not douche, use tampons , or have sex until your health care provider approves. When  you can return to sex, practice safe sex and use condoms. This information is not intended to replace advice given to you by your health care provider. Make sure you discuss any questions you have with your health care provider. Document Revised: 08/11/2017 Document Reviewed: 10/04/2016 Elsevier Patient Education  2020 Elsevier Inc.  

## 2019-11-02 LAB — VAGINITIS/VAGINOSIS, DNA PROBE
Candida Species: NEGATIVE
Gardnerella vaginalis: NEGATIVE
Trichomonas vaginosis: NEGATIVE

## 2019-11-12 ENCOUNTER — Other Ambulatory Visit: Payer: Self-pay

## 2019-11-12 ENCOUNTER — Encounter: Payer: Self-pay | Admitting: Certified Nurse Midwife

## 2019-11-12 ENCOUNTER — Ambulatory Visit (INDEPENDENT_AMBULATORY_CARE_PROVIDER_SITE_OTHER): Payer: BC Managed Care – PPO | Admitting: Certified Nurse Midwife

## 2019-11-12 VITALS — BP 96/62 | HR 60 | Temp 98.8°F | Resp 16 | Wt 113.0 lb

## 2019-11-12 DIAGNOSIS — N760 Acute vaginitis: Secondary | ICD-10-CM | POA: Diagnosis not present

## 2019-11-12 DIAGNOSIS — N898 Other specified noninflammatory disorders of vagina: Secondary | ICD-10-CM | POA: Diagnosis not present

## 2019-11-12 NOTE — Progress Notes (Signed)
19 y.o. Single Caucasian female G0P0000 here with complaint of vaginal symptoms of itching and  burning, and increase of white discharge. Describes discharge as non odorous. Not sexually active since last visit on 11/01/2019.[name] Onset of symptoms 3 days ago. Denies new personal products. Wears thong underwear at times, but no issues with use that she is aware. No  STD concerns. Urinary symptoms none that she is aware. Contraception is OCP working well.  Review of Systems  Constitutional: Negative.   HENT: Negative.   Eyes: Negative.   Respiratory: Negative.   Cardiovascular: Negative.   Gastrointestinal: Negative.   Genitourinary:       Vaginal itching & discharge  Musculoskeletal: Negative.   Skin: Negative.   Neurological: Negative.   Endo/Heme/Allergies: Negative.   Psychiatric/Behavioral: Negative.     O:Healthy female WDWN Affect: normal, orientation x 3  Exam:Skin: warm and dry Abdomen:soft, non tender, no masses  Inguinal Lymph nodes: no enlargement or tenderness Pelvic exam: External genital: normal female, no lesions, scaling or redness BUS: negative Vagina: normal appearance with copious white discharge noted.  Affirm taken Cervix: normal, non tender, no CMT Uterus: normal, non tender Adnexa:normal, non tender, no masses or fullness noted Rectal area: no lesions or redness   A:Vaginal discharge abnormal appearance Contraception OCP   P:Discussed findings of white discharge and possible yeast etiology. Discussed Aveeno or baking soda sitz bath for comfort. Avoid thong underwear at this time which could be contributing to occurrence. If working out in gym clothes  for long periods of time change underwear. Questions addressed at length.. Discussed OCP can also increase vaginal discharge due to the hormone content in the pill. Lab: Affirm Will treat if indicated.  Rv prn

## 2019-11-12 NOTE — Patient Instructions (Signed)

## 2019-11-12 NOTE — Telephone Encounter (Signed)
Pt sent following mychart message:   Good morning,  I remember last time we talked you mentioned if I started having white, thick and itching discharge it was most likely the yeast infection.  Well today is the 2nd day with me experiencing that.  What is the next thing we should do because this is very uncomfortable and it's reoccurring?   Spoke to pt. Pt states above sx of yeast x 2 days. Pt advised to be seen. Pt scheduled with Johny Shock, CNM on 11/12/2019 at 1130 am. Pt agreeable. CPS neg.   Routing to D. Hollice Espy, CNM for review and will close encounter.

## 2019-11-13 ENCOUNTER — Telehealth: Payer: Self-pay

## 2019-11-13 LAB — VAGINITIS/VAGINOSIS, DNA PROBE
Candida Species: POSITIVE — AB
Gardnerella vaginalis: NEGATIVE
Trichomonas vaginosis: NEGATIVE

## 2019-11-13 NOTE — Telephone Encounter (Signed)
Mailbox not set up. Try call again.

## 2019-11-13 NOTE — Telephone Encounter (Signed)
-----   Message from Regina Eck, CNM sent at 11/13/2019  8:33 AM EST ----- Notify patient her vaginal screen was positive for yeast only. She will need Rx Diflucan 150 mg one today and repeat one in 5 days

## 2019-11-14 ENCOUNTER — Other Ambulatory Visit: Payer: Self-pay

## 2019-11-14 DIAGNOSIS — N898 Other specified noninflammatory disorders of vagina: Secondary | ICD-10-CM

## 2019-11-14 DIAGNOSIS — N760 Acute vaginitis: Secondary | ICD-10-CM

## 2019-11-14 MED ORDER — FLUCONAZOLE 150 MG PO TABS: ORAL_TABLET | ORAL | 0 refills | Status: DC

## 2019-11-15 NOTE — Telephone Encounter (Signed)
Patient notified of results by triage & rx sent to pharmacy.

## 2019-11-25 ENCOUNTER — Encounter: Payer: Self-pay | Admitting: Certified Nurse Midwife

## 2019-11-27 ENCOUNTER — Encounter: Payer: Self-pay | Admitting: Certified Nurse Midwife

## 2020-01-06 ENCOUNTER — Telehealth: Payer: Self-pay

## 2020-01-06 NOTE — Telephone Encounter (Signed)
Patients mother called regarding appointment scheduled for Wednesday (01/06/20). Patients mother stated that the patient did not want to wait until then and would rather see a different provider today or tomorrow.

## 2020-01-06 NOTE — Telephone Encounter (Signed)
Left message for pt to return call to triage RN. 

## 2020-01-07 ENCOUNTER — Ambulatory Visit: Payer: BC Managed Care – PPO | Admitting: Obstetrics and Gynecology

## 2020-01-07 ENCOUNTER — Encounter: Payer: Self-pay | Admitting: Obstetrics and Gynecology

## 2020-01-07 ENCOUNTER — Other Ambulatory Visit: Payer: Self-pay

## 2020-01-07 ENCOUNTER — Ambulatory Visit (INDEPENDENT_AMBULATORY_CARE_PROVIDER_SITE_OTHER): Payer: BC Managed Care – PPO | Admitting: Obstetrics and Gynecology

## 2020-01-07 VITALS — BP 112/60 | HR 76 | Temp 98.1°F | Ht 62.0 in | Wt 114.6 lb

## 2020-01-07 DIAGNOSIS — Z202 Contact with and (suspected) exposure to infections with a predominantly sexual mode of transmission: Secondary | ICD-10-CM | POA: Diagnosis not present

## 2020-01-07 DIAGNOSIS — Z708 Other sex counseling: Secondary | ICD-10-CM

## 2020-01-07 DIAGNOSIS — Z113 Encounter for screening for infections with a predominantly sexual mode of transmission: Secondary | ICD-10-CM

## 2020-01-07 MED ORDER — AZITHROMYCIN 1 G PO PACK: 1.0000 | PACK | Freq: Once | ORAL | 0 refills | Status: AC

## 2020-01-07 NOTE — Progress Notes (Signed)
GYNECOLOGY  VISIT   HPI: 19 y.o.   Single White or Caucasian Not Hispanic or Latino  female   G0P0000 with Patient's last menstrual period was 01/06/2020.   here for possible infection. Patient states that she has been "involved" with a new partner for about 2 months. Per patient, last Thursday, 01/02/20, her partner got STD testing and tested positive for Chlamydia. Patient states that she has not had any symptoms. Not using condoms.       GYNECOLOGIC HISTORY: Patient's last menstrual period was 01/06/2020. Contraception:Sprintec Menopausal hormone therapy: n/a        OB History    Gravida  0   Para  0   Term  0   Preterm  0   AB  0   Living  0     SAB  0   TAB  0   Ectopic  0   Multiple  0   Live Births  0              Patient Active Problem List   Diagnosis Date Noted  . Close exposure to COVID-19 virus 04/01/2019  . Vaginitis 01/01/2019  . Routine general medical examination at a health care facility 11/15/2018  . Irregular menses 04/13/2018  . Headache disorder 03/26/2018  . Epigastric hernia 01/04/2018  . Tremor 06/16/2017  . Family history of headaches 07/13/2016  . Well adolescent visit 03/11/2014  . Hx of hernia repair 03/11/2014    Past Medical History:  Diagnosis Date  . Family history of allergies   . Headache    migraines - 2x/mo.  milder HA more often  . History of headache   . history of heart murmur   . History of hypotension   . Migraine without aura     Past Surgical History:  Procedure Laterality Date  . FOOT SURGERY Left   . HERNIA REPAIR     abdominal hernia 2010 at [facility]  . TYMPANOSTOMY TUBE PLACEMENT     36 months of age    Current Outpatient Medications  Medication Sig Dispense Refill  . norgestimate-ethinyl estradiol (SPRINTEC 28) 0.25-35 MG-MCG tablet Take 1 tablet by mouth daily. 3 Package 3  . SUMAtriptan (IMITREX) 25 MG tablet TAKE 1 TABLET BY MOUTH AT ONSET OF HEADACHE MAY TAKE AN ADDITONAL TABLET EVERY 2 HOURS  IF NO RELEIF MAX OF 3 TABS IN 24 HOURS 12 tablet 1   No current facility-administered medications for this visit.     ALLERGIES: Patient has no known allergies.  Family History  Problem Relation Age of Onset  . Breast cancer Other        paternal great grandmother  . Hyperlipidemia Maternal Grandfather   . Heart disease Maternal Grandfather   . Hypertension Maternal Grandfather   . Diabetes Maternal Grandfather   . Hypertension Mother   . Hypertension Father   . Diabetes Father   . Hypertension Maternal Grandmother   . Diabetes Maternal Grandmother   . Kidney disease Other        great grandfather    Social History   Socioeconomic History  . Marital status: Single    Spouse name: Not on file  . Number of children: Not on file  . Years of education: Not on file  . Highest education level: Not on file  Occupational History  . Not on file  Tobacco Use  . Smoking status: Never Smoker  . Smokeless tobacco: Never Used  Substance and Sexual Activity  . Alcohol  use: No    Alcohol/week: 0.0 standard drinks  . Drug use: No  . Sexual activity: Yes    Birth control/protection: Pill  Other Topics Concern  . Not on file  Social History Narrative   Softball and basketball   [place]   Social Determinants of Health   Financial Resource Strain:   . Difficulty of Paying Living Expenses:   Food Insecurity:   . Worried About Charity fundraiser in the Last Year:   . Arboriculturist in the Last Year:   Transportation Needs:   . Film/video editor (Medical):   [name] Lack of Transportation (Non-Medical):   Physical Activity:   . Days of Exercise per Week:   . Minutes of Exercise per Session:   Stress:   . Feeling of Stress :   Social Connections:   . Frequency of Communication with Friends and Family:   . Frequency of Social Gatherings with Friends and Family:   . Attends Religious Services:   . Active Member of Clubs or Organizations:   . Attends  Archivist Meetings:   [name] Marital Status:   Intimate Partner Violence:   . Fear of Current or Ex-Partner:   . Emotionally Abused:   [name] Physically Abused:   . Sexually Abused:     Review of Systems  Constitutional: Negative.   HENT: Negative.   Eyes: Negative.   Respiratory: Negative.   Cardiovascular: Negative.   Gastrointestinal: Negative.   Genitourinary: Negative.   Musculoskeletal: Negative.   Skin: Negative.   Neurological: Negative.   Endo/Heme/Allergies: Negative.   Psychiatric/Behavioral: Negative.     PHYSICAL EXAMINATION:    BP 112/60 (BP Location: Right Arm, Patient Position: Sitting, Cuff Size: Normal)   Pulse 76   Temp 98.1 F (36.7 C) (Temporal)   Ht 5\' 2"  (1.575 m)   Wt 114 lb 9.6 oz (52 kg)   LMP 01/06/2020   BMI 20.96 kg/m     General appearance: alert, cooperative and appears stated age  Pelvic: External genitalia:  no lesions              Urethra:  normal appearing urethra with no masses, tenderness or lesions              Bartholins and Skenes: normal                 Vagina: normal appearing vagina with normal color and discharge, no lesions              Cervix: no cervical motion tenderness and no lesions              Bimanual Exam:  Uterus:  normal size, contour, position, consistency, mobility, non-tender              Adnexa: no mass, fullness, tenderness               Chaperone, Terence Lux, was present for exam.  ASSESSMENT Exposure to Chlamydia Screening STD    PLAN Treat with Azithromycin Discussed risks of Chlamydia, information placed in instructions Full STD testing (except for HSV) Strongly encouraged to use condoms.

## 2020-01-07 NOTE — Patient Instructions (Signed)
Chlamydia, Female Chlamydia is an STD (sexually transmitted disease). It is a bacterial infection that spreads (is contagious) through sexual contact. Chlamydia can occur in different areas of the body, including:  The tube that moves urine from the bladder out of the body (urethra).  The lower part of the uterus (cervix).  The throat.  The rectum. This condition is not difficult to treat. However, if left untreated, chlamydia can lead to more serious health problems, including pelvic inflammatory disorder (PID). PID can increase your risk of not being able to have children (sterility). Also, if chlamydia is left untreated and you are pregnant or become pregnant, there is a chance that your baby can become infected during delivery. This may cause serious health problems for the baby. What are the causes? Chlamydia is caused by the bacteria Chlamydia trachomatis. It is passed from an infected partner during sexual activity. Chlamydia can spread through contact with the genitals, mouth, or rectum. What are the signs or symptoms? In some cases, there may not be any symptoms for this condition (asymptomatic), especially early in the infection. If symptoms develop, they may include:  Burning with urination.  Frequent urination.  Vaginal discharge.  Redness, soreness, and swelling (inflammation) of the rectum.  Bleeding or discharge from the rectum.  Abdominal pain.  Pain during sexual intercourse.  Bleeding between menstrual periods.  Itching, burning, or redness in the eyes, or discharge from the eyes. How is this diagnosed? This condition may be diagnosed with:  Urine tests.  Swab tests. Depending on your symptoms, your health care provider may use a cotton swab to collect discharge from your vagina or rectum to test for the bacteria.  A pelvic exam. How is this treated? This condition is treated with antibiotic medicines. If you are pregnant, certain types of antibiotics will  need to be avoided. Follow these instructions at home: Medicines  Take over-the-counter and prescription medicines only as told by your health care provider.  Take your antibiotic medicine as told by your health care provider. Do not stop taking the antibiotic even if you start to feel better. Sexual activity  Tell sexual partners about your infection. This includes any oral, anal, or vaginal sex partners you have had within 60 days of when your symptoms started. Sexual partners should also be treated, even if they have no signs of the disease.  Do not have sex until you and your sexual partners have completed treatment and your health care provider says it is okay. If your health care provider prescribed you a single dose treatment, wait 7 days after taking the treatment before having sex. General instructions  It is your responsibility to get your test results. Ask your health care provider, or the department performing the test, when your results will be ready.  Get plenty of rest.  Eat a healthy, well-balanced diet.  Drink enough fluids to keep your urine clear or pale yellow.  Keep all follow-up visits as told by your health care provider. This is important. You may need to be tested for infection again 3 months after treatment. How is this prevented? The only sure way to prevent chlamydia is to avoid having sex. However, you can lower your risk by:  Using latex condoms correctly every time you have sex.  Not having multiple sexual partners.  Asking if your sexual partner has been tested for STIs and had negative results. Contact a health care provider if:  You develop new symptoms or your symptoms do not get  better after completing treatment.  You have a fever or chills.  You have pain during sexual intercourse. Get help right away if:  Your pain gets worse and does not get better with medicine.  You develop flu-like symptoms, such as night sweats, sore throat, or  muscle aches.  You experience nausea or vomiting.  You have difficulty swallowing.  You have bleeding between periods or after sex.  You have irregular menstrual periods.  You have abdominal or lower back pain that does not get better with medicine.  You feel weak or dizzy, or you faint.  You are pregnant and you develop symptoms of chlamydia. Summary  Chlamydia is an STD (sexually transmitted disease). It is a bacterial infection that spreads (is contagious) through sexual contact.  This condition is not difficult to treat, however. If left untreated, chlamydia can lead to more serious health problems, including pelvic inflammatory disease (PID).  In some cases, there may not be any symptoms for this condition (asymptomatic).  This condition is treated with antibiotic medicines.  Using latex condoms correctly every time you have sex can help prevent chlamydia. This information is not intended to replace advice given to you by your health care provider. Make sure you discuss any questions you have with your health care provider. Document Revised: 02/20/2018 Document Reviewed: 08/15/2016 Elsevier Patient Education  2020 Elsevier Inc.  

## 2020-01-08 ENCOUNTER — Ambulatory Visit: Payer: BC Managed Care – PPO | Admitting: Obstetrics and Gynecology

## 2020-01-08 LAB — HEPATITIS C ANTIBODY: Hep C Virus Ab: <0.1 s/co ratio (ref 0.0–0.9)

## 2020-01-08 LAB — CHLAMYDIA/GONOCOCCUS/TRICHOMONAS, NAA
Chlamydia by NAA: POSITIVE — AB
Gonococcus by NAA: NEGATIVE
Trich vag by NAA: NEGATIVE

## 2020-01-08 LAB — HEP, RPR, HIV PANEL
HIV Screen 4th Generation wRfx: NONREACTIVE
Hepatitis B Surface Ag: NEGATIVE
RPR Ser Ql: NONREACTIVE

## 2020-01-09 ENCOUNTER — Other Ambulatory Visit: Payer: BC Managed Care – PPO

## 2020-01-09 NOTE — Telephone Encounter (Signed)
-----   Message from Salvadore Dom, MD sent at 01/09/2020  8:49 AM EDT ----- Please let the patient know that her test did return + for chlamydia. She was treated 2 days ago for a known exposure. They shouldn't be sexually active until 1 week after they have both been treated and they should use condoms.  She needs repeat testing in 3 months, please set that up.

## 2020-01-09 NOTE — Telephone Encounter (Signed)
Burnice Logan, RN  01/09/2020 10:31 AM EDT    Call to patient, no answer, voicemail not set up, unable to leave message. No alternative number.   Confidential Communicable Disease Report Completed and faxed to [facility].

## 2020-01-10 NOTE — Telephone Encounter (Signed)
MyChart message to patient.  

## 2020-01-10 NOTE — Telephone Encounter (Signed)
Call to patient, no answer, voicemail not set up, unable to leave message. No alternative number.

## 2020-01-13 NOTE — Telephone Encounter (Signed)
Call to patient, no answer, voicemail not set. No alternative contact listed on dpr.

## 2020-01-14 NOTE — Telephone Encounter (Signed)
Patient is returning call to Jill. °

## 2020-01-14 NOTE — Telephone Encounter (Signed)
Call returned to patient, no answer. Voicemail not setup.   If I am not available, patient can speak with any available triage nurse.

## 2020-01-20 NOTE — Telephone Encounter (Signed)
Patient is returning call.  °

## 2020-01-21 NOTE — Telephone Encounter (Signed)
Spoke with patient, advised as seen below per Dr. Talbert Nan.  OV scheduled for 03/20/20 at 10am with Dr. Talbert Nan.  Patient verbalizes understanding and is agreeable.   Encounter closed.

## 2020-01-29 ENCOUNTER — Encounter: Payer: Self-pay | Admitting: Obstetrics and Gynecology

## 2020-01-30 ENCOUNTER — Telehealth: Payer: Self-pay

## 2020-01-30 NOTE — Telephone Encounter (Signed)
AEX 06/2019 with JJ +CHL 01/07/20 +yeast x 3 in past year  +BV 07/2018  Spoke with pt. Pt states having vaginal discharge and odor that hasn't resolved since being treated for +Chlamydia in April. Denies pain, vaginal itching, bleeding or any UTI sx. Pt states has increased water, using dove bar soap, cotton underwear and eating yogurt to help prevent yeast or BV. Denies douching or being SA. Pt states last SA was before treatment of chlamydia. Pt advised to be seen for further evaluation. Pt has TOC appt on 03/20/20 with Dr Talbert Nan. Pt requesting OV 5/21, pt aware Dr Talbert Nan not in office. Pt agreeable to see other provider. Pt scheduled with Dr Sabra Heck at 2 pm. Pt verbalized understanding and agreeable. CPS neg.   Routing to Dr Talbert Nan for FYI Cc: Dr Sabra Heck.  Encounter closed.

## 2020-01-30 NOTE — Telephone Encounter (Signed)
Attempted to call pt. Voicemail box not set up and no answer or message left. Will send mychart message for pt to return call to office.

## 2020-01-30 NOTE — Telephone Encounter (Signed)
Sydney Dunn, [facility]  Phone Number: [phone]  Hey Dr Talbert Nan,   I'm continuing to have a heavy, whitish discharge with cloudy pee and a strong odor, just like all the times before. Also i can't continue to drive up there every week or 2-3 weeks to be tested for the same thing especially when i'm in school and working. Is there something else that can be done? It's causing me to be very embarrassed and self conscious.  The medicine i've been given before doesn't seem to help much. Do I need specific blood work or to be referred to a specialist?   thank you  gracie

## 2020-01-31 ENCOUNTER — Other Ambulatory Visit: Payer: Self-pay

## 2020-01-31 ENCOUNTER — Encounter: Payer: Self-pay | Admitting: Obstetrics & Gynecology

## 2020-01-31 ENCOUNTER — Ambulatory Visit (INDEPENDENT_AMBULATORY_CARE_PROVIDER_SITE_OTHER): Payer: BC Managed Care – PPO | Admitting: Obstetrics & Gynecology

## 2020-01-31 VITALS — BP 122/60 | HR 80 | Temp 98.1°F | Ht 62.0 in | Wt 113.0 lb

## 2020-01-31 DIAGNOSIS — N898 Other specified noninflammatory disorders of vagina: Secondary | ICD-10-CM

## 2020-01-31 MED ORDER — TINIDAZOLE 500 MG PO TABS: 2.0000 g | ORAL_TABLET | Freq: Every day | ORAL | 0 refills | Status: DC

## 2020-01-31 NOTE — Progress Notes (Signed)
GYNECOLOGY  VISIT  CC:   Patient complains of having vaginal odor and thin white discharge  HPI: 19 y.o. G0P0000 Single White female here for complaint of vaginal discharge.  Feels like she has discharge all of the time.  She is having some increased odor with her discharge as well.  Pt feels like this is more than just regular discharge.  Last cycle was 4/26.  This was normal.  Denies pelvic pain.    Had chlamydia diagnosed end of April.  Partner was treated before being SA.  Pt is anxious about waiting until July for repeat treatment.  Would like testing today as well.  As has been over 2 weeks, feel this is reasonable.  Denies fever.      GYNECOLOGIC HISTORY: Patient's last menstrual period was 01/06/2020. Contraception: Sprintec Menopausal hormone therapy: n/a  Patient Active Problem List   Diagnosis Date Noted  . Close exposure to COVID-19 virus 04/01/2019  . Vaginitis 01/01/2019  . Routine general medical examination at a health care facility 11/15/2018  . Irregular menses 04/13/2018  . Headache disorder 03/26/2018  . Epigastric hernia 01/04/2018  . Tremor 06/16/2017  . Family history of headaches 07/13/2016  . Well adolescent visit 03/11/2014  . Hx of hernia repair 03/11/2014    Past Medical History:  Diagnosis Date  . Family history of allergies   . Headache    migraines - 2x/mo.  milder HA more often  . History of headache   . history of heart murmur   . History of hypotension   . Migraine without aura     Past Surgical History:  Procedure Laterality Date  . FOOT SURGERY Left   . HERNIA REPAIR     abdominal hernia 2010 at [facility]  . TYMPANOSTOMY TUBE PLACEMENT     19 months of age    MEDS:   Current Outpatient Medications on File Prior to Visit  Medication Sig Dispense Refill  . norgestimate-ethinyl estradiol (SPRINTEC 28) 0.25-35 MG-MCG tablet Take 1 tablet by mouth daily. 3 Package 3  . SUMAtriptan (IMITREX) 25 MG tablet TAKE 1 TABLET BY MOUTH AT ONSET OF  HEADACHE MAY TAKE AN ADDITONAL TABLET EVERY 2 HOURS IF NO RELEIF MAX OF 3 TABS IN 24 HOURS 12 tablet 1   No current facility-administered medications on file prior to visit.    ALLERGIES: Patient has no known allergies.  Family History  Problem Relation Age of Onset  . Breast cancer Other        paternal great grandmother  . Hyperlipidemia Maternal Grandfather   . Heart disease Maternal Grandfather   . Hypertension Maternal Grandfather   . Diabetes Maternal Grandfather   . Hypertension Mother   . Hypertension Father   . Diabetes Father   . Hypertension Maternal Grandmother   . Diabetes Maternal Grandmother   . Kidney disease Other        great grandfather    SH:  Single, non smoker  Review of Systems  Genitourinary: Positive for vaginal discharge.       Vaginal odor  All other systems reviewed and are negative.   PHYSICAL EXAMINATION:    BP 122/60 (BP Location: Right Arm, Patient Position: Sitting, Cuff Size: Normal)   Pulse 80   Temp 98.1 F (36.7 C) (Temporal)   Ht 5\' 2"  (1.575 m)   Wt 113 lb (51.3 kg)   LMP 01/06/2020   BMI 20.67 kg/m     General appearance: alert, cooperative and appears stated age Flank:  No CVA tenderness Abdomen: soft, non-tender; bowel sounds normal; no masses,  no organomegaly Lymph:  no inguinal LAD noted  Pelvic: External genitalia:  no lesions              Urethra:  normal appearing urethra with no masses, tenderness or lesions              Bartholins and Skenes: normal                 Vagina: normal appearing vagina, frothy appearing yellowish discharge, no lesions              Cervix: no lesions              Bimanual Exam:  Uterus:  normal size, contour, position, consistency, mobility, non-tender              Adnexa: no mass, fullness, tenderness  Chaperone, Terence Lux, CMA, was present for exam.  Assessment: Frothy appearing vaginal discharge most c/w yeast or trich Recent chlamydia diagnosis, treated as was  partner  Plan: Vaginitis testing obtained today Will treat like this is BV, tindamax 2 grams po x 1, repeat 24 hours.  #8/0RF.

## 2020-02-03 LAB — NUSWAB VAGINITIS PLUS (VG+)
Atopobium vaginae: HIGH Score — AB
Candida albicans, NAA: NEGATIVE
Candida glabrata, NAA: NEGATIVE
Chlamydia trachomatis, NAA: NEGATIVE
Megasphaera 1: HIGH Score — AB
Neisseria gonorrhoeae, NAA: NEGATIVE
Trich vag by NAA: NEGATIVE

## 2020-02-19 ENCOUNTER — Encounter: Payer: Self-pay | Admitting: Obstetrics & Gynecology

## 2020-02-19 ENCOUNTER — Telehealth: Payer: Self-pay | Admitting: *Deleted

## 2020-02-19 MED ORDER — TINIDAZOLE 500 MG PO TABS: 1.0000 g | ORAL_TABLET | Freq: Every day | ORAL | 0 refills | Status: AC

## 2020-02-19 NOTE — Telephone Encounter (Signed)
Spoke with pt. Pt given recommendations on new Rx per Dr Sabra Heck. Pt agreeable and verbalized understanding. Pt to call and give update on 02/24/20 if sx not completely resolved. Pt agreeable.  Routing to Dr Sabra Heck for review Encounter closed.  Rx sent to pharmacy on file. #10, 0RF

## 2020-02-19 NOTE — Telephone Encounter (Signed)
Pt sent mychart message.  Reviewed lab results from 01/31/20   Miss Maxim, Your testing was negative for gonorrhea, chlamydia and trichomonas. He yeast testing was negative. BV was present. You were treated correctly. Please let me know if the symptoms have not fully resolved. As we discussed, the short dose of Tindamax sometimes doesn't fully resolve the BV and you need a longer 5 day course. Thanks.  Edwinna Areola    Routing to Dr Sabra Heck for review and recommendations.   Need another OV or call in new Rx?

## 2020-02-19 NOTE — Telephone Encounter (Signed)
Sydney Dunn, [facility]  Phone Number: [phone]  Good morning,  So I've had white discharge again, kinda cotton cheese like but this time it's not as supper heavy and the only thing that's bothering me bad is it's itchy. It doesn't really have a smell. I have lunch from 1-2 if you need to call and I probably won't be able to come up there this week anytime. Is there anything you can prescribe, because it's getting hard for me to come there.  Thank you!

## 2020-02-19 NOTE — Telephone Encounter (Signed)
Ok to treat with tindamax 1 gram po daily x 5 days.  Does need recheck if doesn't resolve.  Thanks.

## 2020-02-28 ENCOUNTER — Telehealth: Payer: Self-pay

## 2020-02-28 NOTE — Telephone Encounter (Signed)
Left message to call [place] at [phone].   Patient is in 04 recall. Was due for 6 month follow up breast imaging 12/2019 with the [facility]. Per records this has not been completed. Call to patient to follow up on scheduling.

## 2020-03-04 NOTE — Telephone Encounter (Signed)
Patient is having some discharge and would like an appointment this Friday if possible?

## 2020-03-04 NOTE — Telephone Encounter (Signed)
Spoke with patient. Patient was seen in office on 01/31/20 tx with Tindamax for BV. Reports symptoms resolved for 2 days and then returned. Reports white vaginal d/c and intermittent itching. Denies odor or pelvic pain.  Patient is also scheduled for 23mo TOC for chlamydia on 03/20/20. OV cancelled for 03/20/20, r/s for 6/25 at 11am with Dr. Talbert Nan.   Advised on f/u for breast imaging, patient request for RN to schedule on any Friday. Advised I will call to schedule and return call with appt details, patient agreeable.   Call placed to [facility], spoke w/ Langley Gauss. Patient scheduled for left breast US on 03/20/20 at 2:10pm, arrive at 1:50pm. Patient notified of appt date and time, patient is agreeable.   Routing to provider for final review. Patient is agreeable to disposition. Will close encounter.  Cc: Reesa Chew, RN

## 2020-03-05 NOTE — Progress Notes (Deleted)
GYNECOLOGY  VISIT   HPI: 19 y.o.   Single White or Caucasian Not Hispanic or Latino  female   G0P0000 with No LMP recorded.   here for   Vaginal discharge, 3 month test of cure for chlamydia.   GYNECOLOGIC HISTORY: No LMP recorded. Contraception:OCP Menopausal hormone therapy: none         OB History    Gravida  0   Para  0   Term  0   Preterm  0   AB  0   Living  0     SAB  0   TAB  0   Ectopic  0   Multiple  0   Live Births  0              Patient Active Problem List   Diagnosis Date Noted  . Close exposure to COVID-19 virus 04/01/2019  . Vaginitis 01/01/2019  . Routine general medical examination at a health care facility 11/15/2018  . Irregular menses 04/13/2018  . Headache disorder 03/26/2018  . Epigastric hernia 01/04/2018  . Tremor 06/16/2017  . Family history of headaches 07/13/2016  . Well adolescent visit 03/11/2014  . Hx of hernia repair 03/11/2014    Past Medical History:  Diagnosis Date  . Family history of allergies   . Headache    migraines - 2x/mo.  milder HA more often  . History of headache   . history of heart murmur   . History of hypotension   . Migraine without aura     Past Surgical History:  Procedure Laterality Date  . FOOT SURGERY Left   . HERNIA REPAIR     abdominal hernia 2010 at [facility]  . TYMPANOSTOMY TUBE PLACEMENT     60 months of age    Current Outpatient Medications  Medication Sig Dispense Refill  . norgestimate-ethinyl estradiol (SPRINTEC 28) 0.25-35 MG-MCG tablet Take 1 tablet by mouth daily. 3 Package 3  . SUMAtriptan (IMITREX) 25 MG tablet TAKE 1 TABLET BY MOUTH AT ONSET OF HEADACHE MAY TAKE AN ADDITONAL TABLET EVERY 2 HOURS IF NO RELEIF MAX OF 3 TABS IN 24 HOURS 12 tablet 1   No current facility-administered medications for this visit.     ALLERGIES: Patient has no known allergies.  Family History  Problem Relation Age of Onset  . Breast cancer Other        paternal great grandmother  .  Hyperlipidemia Maternal Grandfather   . Heart disease Maternal Grandfather   . Hypertension Maternal Grandfather   . Diabetes Maternal Grandfather   . Hypertension Mother   . Hypertension Father   . Diabetes Father   . Hypertension Maternal Grandmother   . Diabetes Maternal Grandmother   . Kidney disease Other        great grandfather    Social History   Socioeconomic History  . Marital status: Single    Spouse name: Not on file  . Number of children: Not on file  . Years of education: Not on file  . Highest education level: Not on file  Occupational History  . Not on file  Tobacco Use  . Smoking status: Never Smoker  . Smokeless tobacco: Never Used  Vaping Use  . Vaping Use: Never used  Substance and Sexual Activity  . Alcohol use: No    Alcohol/week: 0.0 standard drinks  . Drug use: No  . Sexual activity: Yes    Birth control/protection: Pill  Other Topics Concern  . Not on  file  Social History Narrative   Softball and basketball   [place]   Social Determinants of Health   Financial Resource Strain:   . Difficulty of Paying Living Expenses:   Food Insecurity:   . Worried About Charity fundraiser in the Last Year:   . Arboriculturist in the Last Year:   Transportation Needs:   . Film/video editor (Medical):   [name] Lack of Transportation (Non-Medical):   Physical Activity:   . Days of Exercise per Week:   . Minutes of Exercise per Session:   Stress:   . Feeling of Stress :   Social Connections:   . Frequency of Communication with Friends and Family:   . Frequency of Social Gatherings with Friends and Family:   . Attends Religious Services:   . Active Member of Clubs or Organizations:   . Attends Archivist Meetings:   [name] Marital Status:   Intimate Partner Violence:   . Fear of Current or Ex-Partner:   . Emotionally Abused:   [name] Physically Abused:   . Sexually Abused:     Review of Systems  Constitutional:  Negative.   HENT: Negative.   Eyes: Negative.   Respiratory: Negative.   Cardiovascular: Negative.   Gastrointestinal: Negative.   Genitourinary: Negative.   Musculoskeletal: Negative.   Skin: Negative.   Neurological: Negative.   Endo/Heme/Allergies: Negative.   Psychiatric/Behavioral: Negative.     PHYSICAL EXAMINATION:    There were no vitals taken for this visit.    General appearance: alert, cooperative and appears stated age Neck: no adenopathy, supple, symmetrical, trachea midline and thyroid {CHL AMB PHY EX THYROID NORM DEFAULT:[phone]::"normal to inspection and palpation"} Breasts: {Exam; breast:13139::"normal appearance, no masses or tenderness"} Abdomen: soft, non-tender; non distended, no masses,  no organomegaly  Pelvic: External genitalia:  no lesions              Urethra:  normal appearing urethra with no masses, tenderness or lesions              Bartholins and Skenes: normal                 Vagina: normal appearing vagina with normal color and discharge, no lesions              Cervix: {CHL AMB PHY EX CERVIX NORM [phone]::"no lesions"}              Bimanual Exam:  Uterus:  {CHL AMB PHY EX UTERUS NORM [phone]::"normal size, contour, position, consistency, mobility, non-tender"}              Adnexa: {CHL AMB PHY EX ADNEXA NO MASS [phone]::"no mass, fullness, tenderness"}              Rectovaginal: {yes no:[id number]}.  Confirms.              Anus:  normal sphincter tone, no lesions  Chaperone was present for exam.  ASSESSMENT     PLAN    An After Visit Summary was printed and given to the patient.  *** minutes face to face time of which over 50% was spent in counseling.

## 2020-03-06 ENCOUNTER — Telehealth: Payer: Self-pay | Admitting: Obstetrics and Gynecology

## 2020-03-06 ENCOUNTER — Ambulatory Visit: Payer: Self-pay | Admitting: Obstetrics and Gynecology

## 2020-03-06 NOTE — Telephone Encounter (Signed)
Patient's mom called to cancel appointment because patient is sick. Did not reschedule.

## 2020-03-19 ENCOUNTER — Encounter: Payer: Self-pay | Admitting: Obstetrics & Gynecology

## 2020-03-19 NOTE — Progress Notes (Deleted)
GYNECOLOGY  VISIT  CC:   ***  HPI: 19 y.o. G0P0000 Single White or Caucasian female here for discharge.  GYNECOLOGIC HISTORY: No LMP recorded. Contraception: *** Menopausal hormone therapy: ***  Patient Active Problem List   Diagnosis Date Noted  . Close exposure to COVID-19 virus 04/01/2019  . Vaginitis 01/01/2019  . Routine general medical examination at a health care facility 11/15/2018  . Irregular menses 04/13/2018  . Headache disorder 03/26/2018  . Epigastric hernia 01/04/2018  . Tremor 06/16/2017  . Family history of headaches 07/13/2016  . Well adolescent visit 03/11/2014  . Hx of hernia repair 03/11/2014    Past Medical History:  Diagnosis Date  . Family history of allergies   . Headache    migraines - 2x/mo.  milder HA more often  . History of headache   . history of heart murmur   . History of hypotension   . Migraine without aura     Past Surgical History:  Procedure Laterality Date  . FOOT SURGERY Left   . HERNIA REPAIR     abdominal hernia 2010 at [facility]  . TYMPANOSTOMY TUBE PLACEMENT     74 months of age    MEDS:   Current Outpatient Medications on File Prior to Visit  Medication Sig Dispense Refill  . norgestimate-ethinyl estradiol (SPRINTEC 28) 0.25-35 MG-MCG tablet Take 1 tablet by mouth daily. 3 Package 3  . SUMAtriptan (IMITREX) 25 MG tablet TAKE 1 TABLET BY MOUTH AT ONSET OF HEADACHE MAY TAKE AN ADDITONAL TABLET EVERY 2 HOURS IF NO RELEIF MAX OF 3 TABS IN 24 HOURS 12 tablet 1   No current facility-administered medications on file prior to visit.    ALLERGIES: Patient has no known allergies.  Family History  Problem Relation Age of Onset  . Breast cancer Other        paternal great grandmother  . Hyperlipidemia Maternal Grandfather   . Heart disease Maternal Grandfather   . Hypertension Maternal Grandfather   . Diabetes Maternal Grandfather   . Hypertension Mother   . Hypertension Father   . Diabetes Father   . Hypertension Maternal  Grandmother   . Diabetes Maternal Grandmother   . Kidney disease Other        great grandfather    SH:  ***  Review of Systems  PHYSICAL EXAMINATION:    There were no vitals taken for this visit.    General appearance: alert, cooperative and appears stated age Neck: no adenopathy, supple, symmetrical, trachea midline and thyroid {CHL AMB PHY EX THYROID NORM DEFAULT:[phone]::"normal to inspection and palpation"} CV:  {Exam; heart brief:31539} Lungs:  {pe lungs ob:314451::"clear to auscultation, no wheezes, rales or rhonchi, symmetric air entry"} Breasts: {Exam; breast:13139::"normal appearance, no masses or tenderness"} Abdomen: soft, non-tender; bowel sounds normal; no masses,  no organomegaly Lymph:  no inguinal LAD noted  Pelvic: External genitalia:  no lesions              Urethra:  normal appearing urethra with no masses, tenderness or lesions              Bartholins and Skenes: normal                 Vagina: normal appearing vagina with normal color and discharge, no lesions              Cervix: {CHL AMB PHY EX CERVIX NORM DEFAULT:[phone]::"no lesions"}              Bimanual Exam:  Uterus:  {CHL AMB PHY EX UTERUS NORM [phone]::"normal size, contour, position, consistency, mobility, non-tender"}              Adnexa: {CHL AMB PHY EX ADNEXA NO MASS DEFAULT:[phone]::"no mass, fullness, tenderness"}              Rectovaginal: {yes no:314532}.  Confirms.              Anus:  normal sphincter tone, no lesions  Chaperone, ***Terence Lux, CMA, was present for exam.  Assessment: ***  Plan: ***   ~{NUMBERS; -10-45 JOINT ROM:10287} minutes spent with patient >50% of time was in face to face discussion of above.

## 2020-03-20 ENCOUNTER — Inpatient Hospital Stay: Admission: RE | Admit: 2020-03-20 | Payer: BC Managed Care – PPO | Source: Ambulatory Visit

## 2020-03-20 ENCOUNTER — Ambulatory Visit: Payer: BC Managed Care – PPO | Admitting: Obstetrics & Gynecology

## 2020-03-20 ENCOUNTER — Ambulatory Visit: Payer: Self-pay | Admitting: Obstetrics and Gynecology

## 2020-03-20 ENCOUNTER — Encounter: Payer: Self-pay | Admitting: *Deleted

## 2020-03-23 ENCOUNTER — Encounter: Payer: Self-pay | Admitting: Obstetrics & Gynecology

## 2020-03-23 ENCOUNTER — Telehealth: Payer: Self-pay

## 2020-03-23 NOTE — Telephone Encounter (Signed)
[facility] Pool Hey Dr. Sabra Heck!  Im very sorry for not making this past Fridays appointment.    Also when I spoke last the the nurse last she said you were available this coming Friday and I dont want to wait 2 weeks without something to treat what I have going on again. Im having again white, itchy discharge and I think its getting heavier each day. The smell isnt as distinct but maybe a small odor. It started last week and I think its a yeast infection.    Im on a short break right now to send you this message but my lunch is from 1-2 everyday if you can call then sometime or if outside of those ours could you leave me a message? Or message me on here?   Also I would like to after this gets treated to do some deeper digging on to why this keeps reoccurring?   Thank you for your time!

## 2020-03-23 NOTE — Telephone Encounter (Signed)
Left message to call Laiba Fuerte, RN at [facility] [phone].   

## 2020-03-24 ENCOUNTER — Encounter: Payer: Self-pay | Admitting: Obstetrics & Gynecology

## 2020-03-24 NOTE — Telephone Encounter (Signed)
Sydney Dunn [facility] Hey,   If someone could call me between 1-2 that would be great, I know its your lunch time too. If not can someone please message me about this.   Thank you.

## 2020-03-24 NOTE — Telephone Encounter (Signed)
Spoke with pt. Pt had sent mychart message on 03/23/20.  Pt states having yeast sx of vaginal itching, white discharge and slight "yeast" odor.  Pt denies any fever, chills, UTI sx. Pt states has changed to cotton underwear, using Dove soap and when SA, using condoms. Pt wanting to know why she has recurrent yeast or BV. Pt asking what can use OTC to help. Advised can use Monistat 3 day starting tonight to see if sx resolve. Pt agreeable.  Advised to have OV for further evaluation. Pt agreeable. Pt scheduled with Dr Sabra Heck on 7/16 at 4 pm per pt's request of date and time due to school/work schedule. No other appts available. Pt agreeable and verbalized understanding of date and time of appt. CPS neg.   Routing to Dr Sabra Heck for review. Encounter closed.

## 2020-03-26 NOTE — Telephone Encounter (Signed)
See telephone encounter dated 03/23/20.   Encounter closed.

## 2020-03-26 NOTE — Progress Notes (Deleted)
GYNECOLOGY  VISIT  CC:   ***  HPI: 19 y.o. G0P0000 Single White or Caucasian female here for yeast.  GYNECOLOGIC HISTORY: No LMP recorded. Contraception: *** Menopausal hormone therapy: ***  Patient Active Problem List   Diagnosis Date Noted  . Close exposure to COVID-19 virus 04/01/2019  . Vaginitis 01/01/2019  . Routine general medical examination at a health care facility 11/15/2018  . Irregular menses 04/13/2018  . Headache disorder 03/26/2018  . Epigastric hernia 01/04/2018  . Tremor 06/16/2017  . Family history of headaches 07/13/2016  . Well adolescent visit 03/11/2014  . Hx of hernia repair 03/11/2014    Past Medical History:  Diagnosis Date  . Family history of allergies   . Headache    migraines - 2x/mo.  milder HA more often  . History of headache   . history of heart murmur   . History of hypotension   . Migraine without aura     Past Surgical History:  Procedure Laterality Date  . FOOT SURGERY Left   . HERNIA REPAIR     abdominal hernia 2010 at [facility]  . TYMPANOSTOMY TUBE PLACEMENT     8 months of age    MEDS:   Current Outpatient Medications on File Prior to Visit  Medication Sig Dispense Refill  . norgestimate-ethinyl estradiol (SPRINTEC 28) 0.25-35 MG-MCG tablet Take 1 tablet by mouth daily. 3 Package 3  . SUMAtriptan (IMITREX) 25 MG tablet TAKE 1 TABLET BY MOUTH AT ONSET OF HEADACHE MAY TAKE AN ADDITONAL TABLET EVERY 2 HOURS IF NO RELEIF MAX OF 3 TABS IN 24 HOURS 12 tablet 1   No current facility-administered medications on file prior to visit.    ALLERGIES: Patient has no known allergies.  Family History  Problem Relation Age of Onset  . Breast cancer Other        paternal great grandmother  . Hyperlipidemia Maternal Grandfather   . Heart disease Maternal Grandfather   . Hypertension Maternal Grandfather   . Diabetes Maternal Grandfather   . Hypertension Mother   . Hypertension Father   . Diabetes Father   . Hypertension Maternal  Grandmother   . Diabetes Maternal Grandmother   . Kidney disease Other        great grandfather    SH:  ***  Review of Systems  PHYSICAL EXAMINATION:    There were no vitals taken for this visit.    General appearance: alert, cooperative and appears stated age Neck: no adenopathy, supple, symmetrical, trachea midline and thyroid {CHL AMB PHY EX THYROID NORM DEFAULT:[phone]::"normal to inspection and palpation"} CV:  {Exam; heart brief:31539} Lungs:  {pe lungs ob:314451::"clear to auscultation, no wheezes, rales or rhonchi, symmetric air entry"} Breasts: {Exam; breast:13139::"normal appearance, no masses or tenderness"} Abdomen: soft, non-tender; bowel sounds normal; no masses,  no organomegaly Lymph:  no inguinal LAD noted  Pelvic: External genitalia:  no lesions              Urethra:  normal appearing urethra with no masses, tenderness or lesions              Bartholins and Skenes: normal                 Vagina: normal appearing vagina with normal color and discharge, no lesions              Cervix: {CHL AMB PHY EX CERVIX NORM [phone]::"no lesions"}              Bimanual Exam:  Uterus:  {CHL AMB PHY EX UTERUS NORM [phone]::"normal size, contour, position, consistency, mobility, non-tender"}              Adnexa: {CHL AMB PHY EX ADNEXA NO MASS DEFAULT:[phone]::"no mass, fullness, tenderness"}              Rectovaginal: {yes no:314532}.  Confirms.              Anus:  normal sphincter tone, no lesions  Chaperone, ***Terence Lux, CMA, was present for exam.  Assessment: ***  Plan: ***   ~{NUMBERS; -10-45 JOINT ROM:10287} minutes spent with patient >50% of time was in face to face discussion of above.

## 2020-03-27 ENCOUNTER — Ambulatory Visit: Payer: BC Managed Care – PPO | Admitting: Obstetrics & Gynecology

## 2020-03-27 ENCOUNTER — Telehealth: Payer: Self-pay | Admitting: Obstetrics & Gynecology

## 2020-03-27 NOTE — Telephone Encounter (Signed)
Patient canceled her appointment today for yeast symptoms and did not wish to reschedule. . Staff message sent to [facility].

## 2020-03-27 NOTE — Telephone Encounter (Signed)
Routing to Dr. Miller FYI.   Encounter closed.  

## 2020-05-05 ENCOUNTER — Other Ambulatory Visit: Payer: Self-pay

## 2020-05-22 NOTE — Progress Notes (Signed)
GYNECOLOGY  VISIT  CC:   Vaginal discharge  HPI: 19 y.o. G0P0000 Single White or Caucasian female here for discharge that has been present for two weeks.  She does have a new sexual partner for the last two months.  Uses condoms faithfully with this partner.  H/o chlamydia 12/2019.  Was treated.  Follow up 01/2020 negative.  Declines having GC/Chl testing today.  Pt thinks vaginal discharge is more prevalent when she misses pills and has irregular bleeding.  Reports she is not great with taking her pill and she does miss OCPs..  She would like a method that is easier to use or one that she doesn't forget.  Nuva ring, depo Provera, nexplanon and IUDs reviewed.  She is interested in trying Nuva ring.  Has been pleased with acne improvement with OCPs.      LMP:  05/17/2020.  She has finished first week of current pack of pills.  GYNECOLOGIC HISTORY: Patient's last menstrual period was 05/17/2020 (exact date). Contraception: ocp Menopausal hormone therapy: none  Patient Active Problem List   Diagnosis Date Noted  . Close exposure to COVID-19 virus 04/01/2019  . Vaginitis 01/01/2019  . Routine general medical examination at a health care facility 11/15/2018  . Irregular menses 04/13/2018  . Headache disorder 03/26/2018  . Epigastric hernia 01/04/2018  . Tremor 06/16/2017  . Family history of headaches 07/13/2016  . Well adolescent visit 03/11/2014  . Hx of hernia repair 03/11/2014    Past Medical History:  Diagnosis Date  . Family history of allergies   . Headache    migraines - 2x/mo.  milder HA more often  . History of headache   . history of heart murmur   . History of hypotension   . Migraine without aura     Past Surgical History:  Procedure Laterality Date  . FOOT SURGERY Left   . HERNIA REPAIR     abdominal hernia 2010 at [facility]  . TYMPANOSTOMY TUBE PLACEMENT     32 months of age    MEDS:   Current Outpatient Medications on File Prior to Visit  Medication Sig Dispense  Refill  . norgestimate-ethinyl estradiol (SPRINTEC 28) 0.25-35 MG-MCG tablet Take 1 tablet by mouth daily. 3 Package 3  . SUMAtriptan (IMITREX) 25 MG tablet TAKE 1 TABLET BY MOUTH AT ONSET OF HEADACHE MAY TAKE AN ADDITONAL TABLET EVERY 2 HOURS IF NO RELEIF MAX OF 3 TABS IN 24 HOURS 12 tablet 1   No current facility-administered medications on file prior to visit.    ALLERGIES: Patient has no known allergies.  Family History  Problem Relation Age of Onset  . Breast cancer Other        paternal great grandmother  . Hyperlipidemia Maternal Grandfather   . Heart disease Maternal Grandfather   . Hypertension Maternal Grandfather   . Diabetes Maternal Grandfather   . Hypertension Mother   . Hypertension Father   . Diabetes Father   . Hypertension Maternal Grandmother   . Diabetes Maternal Grandmother   . Kidney disease Other        great grandfather    SH:  Single, non smoker  Review of Systems  Constitutional: Negative.   HENT: Negative.   Eyes: Negative.   Respiratory: Negative.   Cardiovascular: Negative.   Gastrointestinal: Negative.   Endocrine: Negative.   Genitourinary:       Vaginal discharge & odor  Musculoskeletal: Negative.   Skin: Negative.   Allergic/Immunologic: Negative.   Neurological: Negative.  Hematological: Negative.   Psychiatric/Behavioral: Negative.     PHYSICAL EXAMINATION:    BP 104/64   Pulse 64   Resp 16   Wt 110 lb (49.9 kg)   LMP 05/17/2020 (Exact Date)   BMI 20.12 kg/m     General appearance: alert, cooperative and appears stated age Abdomen: soft, non-tender; bowel sounds normal; no masses,  no organomegaly Lymph:  no inguinal LAD noted  Pelvic: External genitalia:  no lesions              Urethra:  normal appearing urethra with no masses, tenderness or lesions              Bartholins and Skenes: normal                 Vagina: normal appearing vagina with normal color and discharge, no lesions              Cervix: no lesions and  dark blood at cervical os noted              Bimanual Exam:  Uterus:  normal size, contour, position, consistency, mobility, non-tender              Adnexa: no mass, fullness, tenderness  Chaperone, Royal Hawthorn, CMA, was present for exam.  Assessment: Irregular bleeding likely due to missing pills Vaginal discharge H/o chlamydia 12/2019 with negative follow up testing 01/2020, now using condoms faithfully  Plan: Affirm pending.  Will await results prior to treatment.  Declines GC/Chl testing today Will start Nuva ring after ends with current pack of OCPs.  Rx to pharmacy.  Risks reviewed.  Aware needs back up method if leaves ring out for more than 3 hours.  Will place for 3 weeks and then 1 week out..  #3 rings/1RF.

## 2020-05-25 ENCOUNTER — Other Ambulatory Visit: Payer: Self-pay

## 2020-05-25 ENCOUNTER — Encounter: Payer: Self-pay | Admitting: Obstetrics & Gynecology

## 2020-05-25 ENCOUNTER — Ambulatory Visit: Payer: BC Managed Care – PPO | Admitting: Obstetrics and Gynecology

## 2020-05-25 ENCOUNTER — Ambulatory Visit (INDEPENDENT_AMBULATORY_CARE_PROVIDER_SITE_OTHER): Payer: BC Managed Care – PPO | Admitting: Obstetrics & Gynecology

## 2020-05-25 VITALS — BP 104/64 | HR 64 | Resp 16 | Wt 110.0 lb

## 2020-05-25 DIAGNOSIS — N898 Other specified noninflammatory disorders of vagina: Secondary | ICD-10-CM

## 2020-05-25 DIAGNOSIS — Z30015 Encounter for initial prescription of vaginal ring hormonal contraceptive: Secondary | ICD-10-CM

## 2020-05-25 MED ORDER — ETONOGESTREL-ETHINYL ESTRADIOL 0.12-0.015 MG/24HR VA RING: VAGINAL_RING | VAGINAL | 1 refills | Status: DC

## 2020-05-26 LAB — VAGINITIS/VAGINOSIS, DNA PROBE
Candida Species: POSITIVE — AB
Gardnerella vaginalis: NEGATIVE
Trichomonas vaginosis: NEGATIVE

## 2020-05-27 ENCOUNTER — Telehealth: Payer: Self-pay | Admitting: *Deleted

## 2020-05-27 NOTE — Telephone Encounter (Signed)
Burnice Logan, RN  05/27/2020 4:41 PM EDT Back to Top    Left message to call Sharee Pimple, RN at [place].

## 2020-05-27 NOTE — Telephone Encounter (Signed)
-----   Message from Megan Salon, MD sent at 05/27/2020  4:19 PM EDT ----- Please let pt know this showed yeast.  Ok to treat with diflucan 150mg  po x 1, repeat 72 hours.  #2/0RF or terazol 7 nightly x 7 nights.  She thinks irregular bleeding with her current OCP is contributing.  She is changing to nuva ring with her next cycle.  If she has another yeast vaginitis before the end of the, then she will have what is considered recurrent yeast vaginitis.  There is specific and different treatment for this and we would discuss if she needs to return for symptoms.  I just wanted her to know that I would recommend something additional if this happens again in a short while.  Hopefully, changing contraceptive methods will help as well.

## 2020-05-29 NOTE — Telephone Encounter (Signed)
Left message to call Valrie Jia, RN at [facility] [phone].   

## 2020-06-01 ENCOUNTER — Encounter: Payer: Self-pay | Admitting: Obstetrics & Gynecology

## 2020-06-01 MED ORDER — FLUCONAZOLE 150 MG PO TABS
ORAL_TABLET | ORAL | 0 refills | Status: DC
Start: 2020-06-01 — End: 2020-06-15

## 2020-06-01 NOTE — Telephone Encounter (Signed)
See 05/27/20 telephone encounter.   Encounter closed.

## 2020-06-01 NOTE — Telephone Encounter (Signed)
See MyChart response.  Diflucan RX to verified pharmacy.  Encounter closed.

## 2020-06-01 NOTE — Telephone Encounter (Signed)
Sydney Dunn [facility] Hey Dr Sabra Heck,  I cant take any phone calls, my lunch is the same time as your office. What am I suppose to do about the test results. Did you prescribe me something?    MyChart message to patient with results and recommendations.

## 2020-06-08 ENCOUNTER — Encounter: Payer: Self-pay | Admitting: Obstetrics & Gynecology

## 2020-06-08 ENCOUNTER — Telehealth: Payer: Self-pay

## 2020-06-08 NOTE — Telephone Encounter (Signed)
Patient sent the following message via MyChart.  Hi.  I'm not sure if i'm suppose to wait a little longer before contacting you but it seems my discharge has gotten worse.  It's heavier, the consistency is in between flowy and thick.   I won't be able to answer a phone call until tomorrow between 1pm-2pm.  Thanks for your help again   Sydney Dunn

## 2020-06-09 NOTE — Telephone Encounter (Signed)
Spoke with pt. Pt given recommendations per Dr Sabra Heck. Pt agreeable. Pt states cannot come this week due to work schedule and needing to give notice. Pt scheduled as work-in on 10/4 at 415 pm. Pt agreeable and verbalized understanding to date and time of appt.  Routing to Dr Sabra Heck for review.  Encounter closed.

## 2020-06-09 NOTE — Telephone Encounter (Signed)
I think she should come for repeat testing because we would treat this differently/longer if positive.

## 2020-06-09 NOTE — Telephone Encounter (Signed)
LMP 05/17/20  Spoke with pt. Pt states having increased discharge, slight itching since finishing the Diflucan Rx on 06/04/20. States " got better in between Diflucan Rx on 9/20 and 9/23, but never went away"  Pt states was not given the Terazol cream Rx.  Denies fever, chills, vaginal bleeding or odor at this time. Pt states started Nuvaring on 9/25/21and is going well.  Advised will review with Dr Sabra Heck and return call. Pt agreeable.   Routing to Dr Sabra Heck   Per result note from 05/27/20: Message from Megan Salon, MD sent at 05/27/2020  4:19 PM EDT ----- Please let pt know this showed yeast.  Ok to treat with diflucan 150mg  po x 1, repeat 72 hours.  #2/0RF or terazol 7 nightly x 7 nights.  She thinks irregular bleeding with her current OCP is contributing.  She is changing to nuva ring with her next cycle.  If she has another yeast vaginitis before the end of the, then she will have what is considered recurrent yeast vaginitis.  There is specific and different treatment for this and we would discuss if she needs to return for symptoms.

## 2020-06-11 NOTE — Progress Notes (Signed)
GYNECOLOGY  VISIT  CC:   Vaginal discharge  HPI: 19 y.o. G0P0000 Single White or Caucasian female here for complaint of vaginal discharge and some irritation.  Reports it is better than last week.  Works in the [employer] area and lives in [place] so coming for appts is sometimes difficult.  Pt has been treated for yeast positive testing 01/07/20, 01/31/20, and 05/25/20.  We discussed treatment for persistent or recurrent yeast is this test is positive.  She is ready to proceed.  She has switched to the Nuva ring this month and thus far has not had any irregular bleeding.  Has not used for an entire month at this point.   GYNECOLOGIC HISTORY: Patient's last menstrual period was 05/17/2020 (exact date). Contraception: nuvaring Menopausal hormone therapy: none  Patient Active Problem List   Diagnosis Date Noted  . Vaginitis 01/01/2019  . Irregular menses 04/13/2018  . Headache disorder 03/26/2018  . Epigastric hernia 01/04/2018  . Tremor 06/16/2017  . Family history of headaches 07/13/2016  . Hx of hernia repair 03/11/2014    Past Medical History:  Diagnosis Date  . Family history of allergies   . Headache    migraines - 2x/mo.  milder HA more often  . History of headache   . history of heart murmur   . History of hypotension   . Migraine without aura     Past Surgical History:  Procedure Laterality Date  . FOOT SURGERY Left   . HERNIA REPAIR     abdominal hernia 2010 at [facility]  . TYMPANOSTOMY TUBE PLACEMENT     98 months of age    MEDS:   Current Outpatient Medications on File Prior to Visit  Medication Sig Dispense Refill  . etonogestrel-ethinyl estradiol (NUVARING) 0.12-0.015 MG/24HR vaginal ring Insert vaginally and leave in place for 3 consecutive weeks, then remove for 1 week. 3 each 1  . SUMAtriptan (IMITREX) 25 MG tablet TAKE 1 TABLET BY MOUTH AT ONSET OF HEADACHE MAY TAKE AN ADDITONAL TABLET EVERY 2 HOURS IF NO RELEIF MAX OF 3 TABS IN 24 HOURS 12 tablet 1   No  current facility-administered medications on file prior to visit.    ALLERGIES: Patient has no known allergies.  Family History  Problem Relation Age of Onset  . Breast cancer Other        paternal great grandmother  . Hyperlipidemia Maternal Grandfather   . Heart disease Maternal Grandfather   . Hypertension Maternal Grandfather   . Diabetes Maternal Grandfather   . Hypertension Mother   . Hypertension Father   . Diabetes Father   . Hypertension Maternal Grandmother   . Diabetes Maternal Grandmother   . Kidney disease Other        great grandfather    SH:  Single, non smoker  Review of Systems  Constitutional: Negative.   HENT: Negative.   Eyes: Negative.   Respiratory: Negative.   Cardiovascular: Negative.   Gastrointestinal: Negative.   Endocrine: Negative.   Genitourinary:       White discharge & occasional vaginal itching  Musculoskeletal: Negative.   Skin: Negative.   Allergic/Immunologic: Negative.   Neurological: Negative.   Hematological: Negative.   Psychiatric/Behavioral: Negative.     PHYSICAL EXAMINATION:    BP 120/70   Pulse 68   Resp 16   Wt 110 lb (49.9 kg)   LMP 05/17/2020 (Exact Date)   BMI 20.12 kg/m     General appearance: alert, cooperative and appears stated age Lymph:  no inguinal LAD noted  Pelvic: External genitalia:  no lesions              Urethra:  normal appearing urethra with no masses, tenderness or lesions              Bartholins and Skenes: normal                 Vagina: normal appearing vagina with white discharge, ring present, no lesions              Cervix: no lesions              Bimanual Exam:  Uterus:  normal size, contour, position, consistency, mobility, non-tender              Adnexa: no mass, fullness, tenderness  Chaperone, Terence Lux, CMA, was present for exam.  Assessment: Vaginal discharge H/o three prior positive yeast tests this year  Plan: Affirm obtained.  Will treat with diflucan 150mg  po x  1, repeat every 72 hours for 3 doses.   If test is positive, will initiate treatment with diflucan 150mg  po x 1, weekly.

## 2020-06-15 ENCOUNTER — Ambulatory Visit (INDEPENDENT_AMBULATORY_CARE_PROVIDER_SITE_OTHER): Payer: BC Managed Care – PPO | Admitting: Obstetrics & Gynecology

## 2020-06-15 ENCOUNTER — Other Ambulatory Visit: Payer: Self-pay

## 2020-06-15 ENCOUNTER — Encounter: Payer: Self-pay | Admitting: Obstetrics & Gynecology

## 2020-06-15 VITALS — BP 120/70 | HR 68 | Resp 16 | Wt 110.0 lb

## 2020-06-15 DIAGNOSIS — N898 Other specified noninflammatory disorders of vagina: Secondary | ICD-10-CM

## 2020-06-15 MED ORDER — FLUCONAZOLE 150 MG PO TABS
ORAL_TABLET | ORAL | 0 refills | Status: DC
Start: 2020-06-15 — End: 2020-06-23

## 2020-06-16 LAB — VAGINITIS/VAGINOSIS, DNA PROBE
Candida Species: NEGATIVE
Gardnerella vaginalis: NEGATIVE
Trichomonas vaginosis: NEGATIVE

## 2020-06-22 ENCOUNTER — Telehealth: Payer: Self-pay | Admitting: *Deleted

## 2020-06-22 NOTE — Telephone Encounter (Signed)
-----   Message from Megan Salon, MD sent at 06/21/2020 10:52 PM EDT ----- Please let pt know her vaginitis testing did not show yeast.  She should not start the weekly diflucan at this point.  Ok to stop the prescription for diflucan that I gave her for 3 doses to be taken over 10 days.  If still symptomatic, needs additional vaginitis testing.  I did not test for any STDs except trichomonas.  Thanks.  CC: Royal Hawthorn, CMA

## 2020-06-22 NOTE — Telephone Encounter (Signed)
Burnice Logan, RN  06/22/2020 3:18 PM EDT Back to Top    Left message to call Sharee Pimple, RN at [place].    MyChart message to patient.

## 2020-06-23 NOTE — Telephone Encounter (Signed)
No return call from patient.  MyChart message not read.   Call placed to patient, left detailed message on mobile number, ok per dpr.  Advised as seen below per Dr. Sabra Heck. Return call to office for OV if symptoms are still present or if any additional questions.   Diflucan Rx discontinued.   Encounter closed.

## 2020-06-24 ENCOUNTER — Telehealth: Payer: Self-pay | Admitting: *Deleted

## 2020-06-24 ENCOUNTER — Encounter: Payer: Self-pay | Admitting: Obstetrics & Gynecology

## 2020-06-24 NOTE — Telephone Encounter (Signed)
Sydney Dunn Clinical Pool Hey, I received the messages about my last test result. I also only have taken 2 of the 3 pills you prescribed but I am still having symptoms. I can call the office around 5 if that's okay!    See 06/15/20 results and recommendations per Dr. Sabra Heck.   MyChart message to patient.

## 2020-06-24 NOTE — Telephone Encounter (Signed)
Routing to Dr. Sabra Heck.   Encounter closed.

## 2020-06-24 NOTE — Telephone Encounter (Signed)
See 06/24/20 telephone encounter.   Encounter closed.

## 2020-06-25 NOTE — Progress Notes (Signed)
Pt needed to leave before being seen due to having another appt.

## 2020-06-26 ENCOUNTER — Ambulatory Visit (INDEPENDENT_AMBULATORY_CARE_PROVIDER_SITE_OTHER): Payer: BC Managed Care – PPO | Admitting: Obstetrics & Gynecology

## 2020-06-26 ENCOUNTER — Encounter: Payer: Self-pay | Admitting: Obstetrics & Gynecology

## 2020-06-26 ENCOUNTER — Other Ambulatory Visit: Payer: Self-pay

## 2020-06-26 VITALS — BP 112/64 | HR 68 | Resp 12 | Ht 62.0 in | Wt 108.0 lb

## 2020-06-26 DIAGNOSIS — N761 Subacute and chronic vaginitis: Secondary | ICD-10-CM

## 2020-06-30 NOTE — Progress Notes (Deleted)
GYNECOLOGY  VISIT  CC:   ***  HPI: 19 y.o. G0P0000 Single White or Caucasian female here for vaginitis.  GYNECOLOGIC HISTORY: No LMP recorded. Contraception: *** Menopausal hormone therapy: ***  Patient Active Problem List   Diagnosis Date Noted  . Vaginitis 01/01/2019  . Irregular menses 04/13/2018  . Headache disorder 03/26/2018  . Epigastric hernia 01/04/2018  . Tremor 06/16/2017  . Family history of headaches 07/13/2016  . Hx of hernia repair 03/11/2014    Past Medical History:  Diagnosis Date  . Family history of allergies   . Headache    migraines - 2x/mo.  milder HA more often  . History of headache   . history of heart murmur   . History of hypotension   . Migraine without aura     Past Surgical History:  Procedure Laterality Date  . FOOT SURGERY Left   . HERNIA REPAIR     abdominal hernia 2010 at [facility]  . TYMPANOSTOMY TUBE PLACEMENT     34 months of age    MEDS:   Current Outpatient Medications on File Prior to Visit  Medication Sig Dispense Refill  . etonogestrel-ethinyl estradiol (NUVARING) 0.12-0.015 MG/24HR vaginal ring Insert vaginally and leave in place for 3 consecutive weeks, then remove for 1 week. 3 each 1  . SUMAtriptan (IMITREX) 25 MG tablet TAKE 1 TABLET BY MOUTH AT ONSET OF HEADACHE MAY TAKE AN ADDITONAL TABLET EVERY 2 HOURS IF NO RELEIF MAX OF 3 TABS IN 24 HOURS 12 tablet 1   No current facility-administered medications on file prior to visit.    ALLERGIES: Patient has no known allergies.  Family History  Problem Relation Age of Onset  . Breast cancer Other        paternal great grandmother  . Hyperlipidemia Maternal Grandfather   . Heart disease Maternal Grandfather   . Hypertension Maternal Grandfather   . Diabetes Maternal Grandfather   . Hypertension Mother   . Hypertension Father   . Diabetes Father   . Hypertension Maternal Grandmother   . Diabetes Maternal Grandmother   . Kidney disease Other        great grandfather     SH:  ***  Review of Systems  PHYSICAL EXAMINATION:    There were no vitals taken for this visit.    General appearance: alert, cooperative and appears stated age Neck: no adenopathy, supple, symmetrical, trachea midline and thyroid {CHL AMB PHY EX THYROID NORM DEFAULT:[phone]::"normal to inspection and palpation"} CV:  {Exam; heart brief:31539} Lungs:  {pe lungs ob:314451::"clear to auscultation, no wheezes, rales or rhonchi, symmetric air entry"} Breasts: {Exam; breast:13139::"normal appearance, no masses or tenderness"} Abdomen: soft, non-tender; bowel sounds normal; no masses,  no organomegaly Lymph:  no inguinal LAD noted  Pelvic: External genitalia:  no lesions              Urethra:  normal appearing urethra with no masses, tenderness or lesions              Bartholins and Skenes: normal                 Vagina: normal appearing vagina with normal color and discharge, no lesions              Cervix: {CHL AMB PHY EX CERVIX NORM [phone]::"no lesions"}              Bimanual Exam:  Uterus:  {CHL AMB PHY EX UTERUS NORM [phone]::"normal size, contour, position, consistency, mobility, non-tender"}  Adnexa: {CHL AMB PHY EX ADNEXA NO MASS DEFAULT:[phone]::"no mass, fullness, tenderness"}              Rectovaginal: {yes no:[phone]}.  Confirms.              Anus:  normal sphincter tone, no lesions  Chaperone, ***Terence Lux, CMA, was present for exam.  Assessment: ***  Plan: ***   {NUMBERS; -10-45 JOINT ROM:10287} minutes of total time was spent for this patient encounter, including preparation, face-to-face counseling with the patient and coordination of care, and documentation of the encounter.

## 2020-07-03 ENCOUNTER — Ambulatory Visit: Payer: Self-pay | Admitting: Obstetrics & Gynecology

## 2020-07-14 ENCOUNTER — Telehealth: Payer: Self-pay

## 2020-07-14 NOTE — Telephone Encounter (Signed)
Patients mother called in regards to patient having another BV infection. Patients mother stated that the patient does not want another appointment scheduled at this office that she needs to be referred to a specialist. Patient can only be reached through mychart due to work/school schedule.

## 2020-07-14 NOTE — Telephone Encounter (Signed)
MyChart message to patient.  

## 2020-07-14 NOTE — Telephone Encounter (Signed)
See MyChart message from patient.   OV cancelled for 11/5 w/ Dr. Sabra Heck.  OV scheduled for 11/5 at 11am with Karma Ganja, NP.   Routing to provider for final review. Patient is agreeable to disposition. Will close encounter.  Cc: Karma Ganja, NP

## 2020-07-14 NOTE — Telephone Encounter (Signed)
Spoke with patients mother, Larene Beach, not on dpr.  Mother scheduled OV for patient on 11/4 at 4:30pm with Dr. Sabra Heck. Mother request MyChart message to patient. Advised MyChart message has been sent with recommendations. Patient should contact the office if any additional questions.    MyChart message to patient.

## 2020-07-16 NOTE — Progress Notes (Signed)
GYNECOLOGY  VISIT  CC:   Started vaginal itching 4 days ago, white discharge with mild odor. Could not feel clean. Skin of vulva is very tender. Nuva Ring x 2 months, likes it but is concerned it is contributing to frequent yeast infections  HPI: 19 y.o. G0P0000 Single White or Caucasian female here for vaginitis. White discharge with odor & vaginal itching.  Period Duration (Days): 4-5 Period Pattern: Regular Menstrual Flow: Light Menstrual Control: Tampon Dysmenorrhea: (!) Moderate Dysmenorrhea Symptoms: Cramping, Nausea  GYNECOLOGIC HISTORY: Patient's last menstrual period was 07/04/2020 (exact date). Contraception: nuvaring Menopausal hormone therapy: none  Patient Active Problem List   Diagnosis Date Noted  . Vaginitis 01/01/2019  . Irregular menses 04/13/2018  . Headache disorder 03/26/2018  . Epigastric hernia 01/04/2018  . Tremor 06/16/2017  . Family history of headaches 07/13/2016  . Hx of hernia repair 03/11/2014    Past Medical History:  Diagnosis Date  . Family history of allergies   . Headache    migraines - 2x/mo.  milder HA more often  . History of headache   . history of heart murmur   . History of hypotension   . Migraine without aura     Past Surgical History:  Procedure Laterality Date  . FOOT SURGERY Left   . HERNIA REPAIR     abdominal hernia 2010 at [facility]  . TYMPANOSTOMY TUBE PLACEMENT     9 months of age    MEDS:   Current Outpatient Medications on File Prior to Visit  Medication Sig Dispense Refill  . etonogestrel-ethinyl estradiol (NUVARING) 0.12-0.015 MG/24HR vaginal ring Insert vaginally and leave in place for 3 consecutive weeks, then remove for 1 week. 3 each 1  . fluticasone (FLONASE) 50 MCG/ACT nasal spray Place into the nose.    . SUMAtriptan (IMITREX) 25 MG tablet TAKE 1 TABLET BY MOUTH AT ONSET OF HEADACHE MAY TAKE AN ADDITONAL TABLET EVERY 2 HOURS IF NO RELEIF MAX OF 3 TABS IN 24 HOURS 12 tablet 1   No current  facility-administered medications on file prior to visit.    ALLERGIES: Patient has no known allergies.  Family History  Problem Relation Age of Onset  . Breast cancer Other        paternal great grandmother  . Hyperlipidemia Maternal Grandfather   . Heart disease Maternal Grandfather   . Hypertension Maternal Grandfather   . Diabetes Maternal Grandfather   . Hypertension Mother   . Hypertension Father   . Diabetes Father   . Hypertension Maternal Grandmother   . Diabetes Maternal Grandmother   . Kidney disease Other        great grandfather     Review of Systems  Constitutional: Negative.   HENT: Negative.   Eyes: Negative.   Respiratory: Negative.   Cardiovascular: Negative.   Gastrointestinal: Negative.   Endocrine: Negative.   Genitourinary:       White discharge with odor, vaginal itching  Musculoskeletal: Negative.   Skin: Negative.   Allergic/Immunologic: Negative.   Neurological: Negative.   Hematological: Negative.   Psychiatric/Behavioral: Negative.     PHYSICAL EXAMINATION:    BP 92/60   Pulse 68   Resp 16   Wt 107 lb (48.5 kg)   LMP 07/04/2020 (Exact Date)   BMI 19.57 kg/m     General appearance: alert, cooperative and appears stated age  Lymph:  no inguinal LAD noted  Pelvic: External genitalia:  no lesions, mild erythema  Urethra:  normal appearing urethra with no masses, tenderness or lesions              Bartholins and Skenes: normal                 Vagina: normal appearing vagina with normal color and discharge, no lesions              Cervix: no cervical motion tenderness and no lesions Wet mount: + budding yeast, few WBC, Neg clue, neg trich  Chaperone was present for exam.  Assessment: Yeast vaginitis (recurrent) Std screening Birth control management  Plan: Wet mount ssaline/KOH done in office Terazol 7, 4%, use one applicator full intravaginally x 7 nights, 1 rf Discontinue Nuva Ring and Change to [place], #84, 3  rf GC/CT NuSwab sent to lab Annual exam due 02/2021, age 19, will need pap   .

## 2020-07-17 ENCOUNTER — Encounter: Payer: Self-pay | Admitting: Nurse Practitioner

## 2020-07-17 ENCOUNTER — Ambulatory Visit (INDEPENDENT_AMBULATORY_CARE_PROVIDER_SITE_OTHER): Payer: BC Managed Care – PPO | Admitting: Nurse Practitioner

## 2020-07-17 ENCOUNTER — Other Ambulatory Visit: Payer: Self-pay

## 2020-07-17 ENCOUNTER — Ambulatory Visit: Payer: BC Managed Care – PPO | Admitting: Obstetrics & Gynecology

## 2020-07-17 VITALS — BP 92/60 | HR 68 | Resp 16 | Wt 107.0 lb

## 2020-07-17 DIAGNOSIS — Z3041 Encounter for surveillance of contraceptive pills: Secondary | ICD-10-CM | POA: Diagnosis not present

## 2020-07-17 DIAGNOSIS — B373 Candidiasis of vulva and vagina: Secondary | ICD-10-CM | POA: Diagnosis not present

## 2020-07-17 DIAGNOSIS — Z113 Encounter for screening for infections with a predominantly sexual mode of transmission: Secondary | ICD-10-CM | POA: Diagnosis not present

## 2020-07-17 DIAGNOSIS — B3731 Acute candidiasis of vulva and vagina: Secondary | ICD-10-CM

## 2020-07-17 MED ORDER — LEVONORGESTREL-ETHINYL ESTRAD 0.1-20 MG-MCG PO TABS: 1.0000 | ORAL_TABLET | Freq: Every day | ORAL | 11 refills | Status: DC

## 2020-07-17 MED ORDER — TERCONAZOLE 0.4 % VA CREA: 1.0000 | TOPICAL_CREAM | Freq: Every day | VAGINAL | 1 refills | Status: DC

## 2020-07-18 ENCOUNTER — Encounter: Payer: Self-pay | Admitting: Nurse Practitioner

## 2020-07-18 NOTE — Patient Instructions (Signed)
Vaginal Yeast Infection, Adult  Vaginal yeast infection is a condition that causes vaginal discharge as well as soreness, swelling, and redness (inflammation) of the vagina. This is a common condition. Some women get this infection frequently. What are the causes? This condition is caused by a change in the normal balance of the yeast (candida) and bacteria that live in the vagina. This change causes an overgrowth of yeast, which causes the inflammation. What increases the risk? The condition is more likely to develop in women who:  Take antibiotic medicines.  Have diabetes.  Take birth control pills.  Are pregnant.  Douche often.  Have a weak body defense system (immune system).  Have been taking steroid medicines for a long time.  Frequently wear tight clothing. What are the signs or symptoms? Symptoms of this condition include:  White, thick, creamy vaginal discharge.  Swelling, itching, redness, and irritation of the vagina. The lips of the vagina (vulva) may be affected as well.  Pain or a burning feeling while urinating.  Pain during sex. How is this diagnosed? This condition is diagnosed based on:  Your medical history.  A physical exam.  A pelvic exam. Your health care provider will examine a sample of your vaginal discharge under a microscope. Your health care provider may send this sample for testing to confirm the diagnosis. How is this treated? This condition is treated with medicine. Medicines may be over-the-counter or prescription. You may be told to use one or more of the following:  Medicine that is taken by mouth (orally).  Medicine that is applied as a cream (topically).  Medicine that is inserted directly into the vagina (suppository). Follow these instructions at home:  Lifestyle  Do not have sex until your health care provider approves. Tell your sex partner that you have a yeast infection. That person should go to his or her health care  provider and ask if they should also be treated.  Do not wear tight clothes, such as pantyhose or tight pants.  Wear breathable cotton underwear. General instructions  Take or apply over-the-counter and prescription medicines only as told by your health care provider.  Eat more yogurt. This may help to keep your yeast infection from returning.  Do not use tampons until your health care provider approves.  Try taking a sitz bath to help with discomfort. This is a warm water bath that is taken while you are sitting down. The water should only come up to your hips and should cover your buttocks. Do this 3-4 times per day or as told by your health care provider.  Do not douche.  If you have diabetes, keep your blood sugar levels under control.  Keep all follow-up visits as told by your health care provider. This is important. Contact a health care provider if:  You have a fever.  Your symptoms go away and then return.  Your symptoms do not get better with treatment.  Your symptoms get worse.  You have new symptoms.  You develop blisters in or around your vagina.  You have blood coming from your vagina and it is not your menstrual period.  You develop pain in your abdomen. Summary  Vaginal yeast infection is a condition that causes discharge as well as soreness, swelling, and redness (inflammation) of the vagina.  This condition is treated with medicine. Medicines may be over-the-counter or prescription.  Take or apply over-the-counter and prescription medicines only as told by your health care provider.  Do not douche.   Do not have sex or use tampons until your health care provider approves.  Contact a health care provider if your symptoms do not get better with treatment or your symptoms go away and then return. This information is not intended to replace advice given to you by your health care provider. Make sure you discuss any questions you have with your health care  provider. Document Revised: 03/29/2019 Document Reviewed: 01/15/2018 Elsevier Patient Education  2020 Elsevier Inc.  

## 2020-07-19 LAB — GC/CHLAMYDIA PROBE AMP
Chlamydia trachomatis, NAA: NEGATIVE
Neisseria Gonorrhoeae by PCR: NEGATIVE

## 2020-07-25 ENCOUNTER — Other Ambulatory Visit: Payer: Self-pay | Admitting: Obstetrics and Gynecology

## 2020-09-22 NOTE — Progress Notes (Deleted)
GYNECOLOGY  VISIT  CC:   ***  HPI: 20 y.o. G0P0000 Single White or Caucasian female here for btb on birth control.     GYNECOLOGIC HISTORY: No LMP recorded. Contraception: *** Menopausal hormone therapy: ***  Patient Active Problem List   Diagnosis Date Noted  . Vaginitis 01/01/2019  . Irregular menses 04/13/2018  . Headache disorder 03/26/2018  . Epigastric hernia 01/04/2018  . Tremor 06/16/2017  . Family history of headaches 07/13/2016  . Hx of hernia repair 03/11/2014    Past Medical History:  Diagnosis Date  . Family history of allergies   . Headache    migraines - 2x/mo.  milder HA more often  . History of headache   . history of heart murmur   . History of hypotension   . Migraine without aura     Past Surgical History:  Procedure Laterality Date  . FOOT SURGERY Left   . HERNIA REPAIR     abdominal hernia 2010 at [facility]  . TYMPANOSTOMY TUBE PLACEMENT     110 months of age    MEDS:   Current Outpatient Medications on File Prior to Visit  Medication Sig Dispense Refill  . fluticasone (FLONASE) 50 MCG/ACT nasal spray Place into the nose.    . levonorgestrel-ethinyl estradiol (ALESSE) 0.1-20 MG-MCG tablet Take 1 tablet by mouth daily. 28 tablet 11  . SUMAtriptan (IMITREX) 25 MG tablet TAKE 1 TABLET BY MOUTH AT ONSET OF HEADACHE MAY TAKE AN ADDITONAL TABLET EVERY 2 HOURS IF NO RELEIF MAX OF 3 TABS IN 24 HOURS 12 tablet 1  . terconazole (TERAZOL 7) 0.4 % vaginal cream Place 1 applicator vaginally at bedtime. 45 g 1   No current facility-administered medications on file prior to visit.    ALLERGIES: Patient has no known allergies.  Family History  Problem Relation Age of Onset  . Breast cancer Other        paternal great grandmother  . Hyperlipidemia Maternal Grandfather   . Heart disease Maternal Grandfather   . Hypertension Maternal Grandfather   . Diabetes Maternal Grandfather   . Hypertension Mother   . Hypertension Father   . Diabetes Father   .  Hypertension Maternal Grandmother   . Diabetes Maternal Grandmother   . Kidney disease Other        great grandfather    SH:  ***  Review of Systems  PHYSICAL EXAMINATION:    There were no vitals taken for this visit.    General appearance: alert, cooperative, no acute distress  CV:  {Exam; heart brief:31539} Lungs:  {pe lungs ob:314451::"clear to auscultation, no wheezes, rales or rhonchi, symmetric air entry"} Breasts: {Exam; breast:13139::"normal appearance, no masses or tenderness"} Abdomen: soft, non-tender; bowel sounds normal; no masses,  no organomegaly Lymph:  no inguinal LAD noted  Pelvic: External genitalia:  no lesions              Urethra:  normal appearing urethra with no masses, tenderness or lesions              Bartholins and Skenes: normal                 Vagina: normal appearing vagina               Cervix: {CHL AMB PHY EX CERVIX NORM [phone]::"no lesions"}              Bimanual Exam:  Uterus:  {CHL AMB PHY EX UTERUS NORM [phone]::"normal size, contour, position, consistency, mobility,  non-tender"}              Adnexa: {CHL AMB PHY EX ADNEXA NO MASS [phone]::"no mass, fullness, tenderness"}               Chaperone, ***, CMA, was present for exam.  Assessment: ***  Plan: ***   {NUMBERS; -10-45 JOINT ROM:10287} minutes of total time was spent for this patient encounter, including preparation, face-to-face counseling with the patient and coordination of care, and documentation of the encounter.

## 2020-09-25 ENCOUNTER — Institutional Professional Consult (permissible substitution): Payer: BC Managed Care – PPO | Admitting: Nurse Practitioner

## 2020-09-29 NOTE — Progress Notes (Deleted)
GYNECOLOGY  VISIT  CC:   ***  HPI: 20 y.o. G0P0000 Single White or Caucasian female here for boil.     GYNECOLOGIC HISTORY: No LMP recorded. Contraception: *** Menopausal hormone therapy: ***  Patient Active Problem List   Diagnosis Date Noted  . Vaginitis 01/01/2019  . Irregular menses 04/13/2018  . Headache disorder 03/26/2018  . Epigastric hernia 01/04/2018  . Tremor 06/16/2017  . Family history of headaches 07/13/2016  . Hx of hernia repair 03/11/2014    Past Medical History:  Diagnosis Date  . Family history of allergies   . Headache    migraines - 2x/mo.  milder HA more often  . History of headache   . history of heart murmur   . History of hypotension   . Migraine without aura     Past Surgical History:  Procedure Laterality Date  . FOOT SURGERY Left   . HERNIA REPAIR     abdominal hernia 2010 at [facility]  . TYMPANOSTOMY TUBE PLACEMENT     53 months of age    MEDS:   Current Outpatient Medications on File Prior to Visit  Medication Sig Dispense Refill  . fluticasone (FLONASE) 50 MCG/ACT nasal spray Place into the nose.    . levonorgestrel-ethinyl estradiol (ALESSE) 0.1-20 MG-MCG tablet Take 1 tablet by mouth daily. 28 tablet 11  . SUMAtriptan (IMITREX) 25 MG tablet TAKE 1 TABLET BY MOUTH AT ONSET OF HEADACHE MAY TAKE AN ADDITONAL TABLET EVERY 2 HOURS IF NO RELEIF MAX OF 3 TABS IN 24 HOURS 12 tablet 1  . terconazole (TERAZOL 7) 0.4 % vaginal cream Place 1 applicator vaginally at bedtime. 45 g 1   No current facility-administered medications on file prior to visit.    ALLERGIES: Patient has no known allergies.  Family History  Problem Relation Age of Onset  . Breast cancer Other        paternal great grandmother  . Hyperlipidemia Maternal Grandfather   . Heart disease Maternal Grandfather   . Hypertension Maternal Grandfather   . Diabetes Maternal Grandfather   . Hypertension Mother   . Hypertension Father   . Diabetes Father   . Hypertension  Maternal Grandmother   . Diabetes Maternal Grandmother   . Kidney disease Other        great grandfather    SH:  ***  Review of Systems  PHYSICAL EXAMINATION:    There were no vitals taken for this visit.    General appearance: alert, cooperative, no acute distress  CV:  {Exam; heart brief:31539} Lungs:  {pe lungs ob:314451::"clear to auscultation, no wheezes, rales or rhonchi, symmetric air entry"} Breasts: {Exam; breast:13139::"normal appearance, no masses or tenderness"} Abdomen: soft, non-tender; bowel sounds normal; no masses,  no organomegaly Lymph:  no inguinal LAD noted  Pelvic: External genitalia:  no lesions              Urethra:  normal appearing urethra with no masses, tenderness or lesions              Bartholins and Skenes: normal                 Vagina: normal appearing vagina               Cervix: {CHL AMB PHY EX CERVIX NORM [phone]::"no lesions"}              Bimanual Exam:  Uterus:  {CHL AMB PHY EX UTERUS NORM [phone]::"normal size, contour, position, consistency, mobility, non-tender"}  Adnexa: {CHL AMB PHY EX ADNEXA NO MASS DEFAULT:[phone]::"no mass, fullness, tenderness"}               Chaperone, ***, CMA, was present for exam.  Assessment: ***  Plan: ***   {NUMBERS; -10-45 JOINT ROM:10287} minutes of total time was spent for this patient encounter, including preparation, face-to-face counseling with the patient and coordination of care, and documentation of the encounter.

## 2020-09-30 ENCOUNTER — Telehealth: Payer: Self-pay | Admitting: *Deleted

## 2020-09-30 NOTE — Telephone Encounter (Signed)
Called and spoke with patient. Patient is scheduled for appointment today due to boil. patient will reschedule because she tested positive for covid last week and yesterday tested positive for strep this am at Urgent care. While at urgent care she had the MD look at her boil and was prescribed at antibiotic, reports the doctor told her the boil appeared okay and did not appear the need for drainage. Patient said she is worried after taking antibiotic for 5 days she will have yeast and wanted to know if you would be wiling to send in Rx to pharmacy to help with this? Please advise

## 2020-09-30 NOTE — Telephone Encounter (Signed)
-----   Message from [name], RN sent at 09/30/2020 11:46 AM EST ----- Regarding: Sydney Dunn call Patient scheduled for tomorrow  Wants to speak to nurse about appt.

## 2020-09-30 NOTE — Telephone Encounter (Signed)
I received the message about concern about getting yeast infection while taking antibiotics. I definitely understand this concern because she has been through a lot (it has not always been yeast). Although I know this is a difficult situation and frustrating answer, but, it is not the best practice to call in antifungal medication for prevention.  Not only is it often unnecessary, but it also contributes to yeast becoming resistant to treatment.  I would prefer that if she develops symptoms, to call so we can discuss and treat appropriately.  Please notify.

## 2020-10-01 ENCOUNTER — Other Ambulatory Visit: Payer: Self-pay | Admitting: Nurse Practitioner

## 2020-10-01 ENCOUNTER — Ambulatory Visit: Payer: BC Managed Care – PPO | Admitting: Nurse Practitioner

## 2020-10-01 MED ORDER — FLUCONAZOLE 150 MG PO TABS: 150.0000 mg | ORAL_TABLET | Freq: Once | ORAL | 0 refills | Status: AC

## 2020-10-01 NOTE — Telephone Encounter (Signed)
patient said she has started yeast symptoms late yesterday evening, white discharge and itching only. Please advise

## 2020-10-02 NOTE — Telephone Encounter (Signed)
Left detailed message on cell per DPR access. 

## 2020-10-08 NOTE — Progress Notes (Deleted)
GYNECOLOGY  VISIT  CC:   F/U ED visit for bartholin's cyst  HPI: 20 y.o. G0P0000 Single White or Caucasian female here for cyst.     GYNECOLOGIC HISTORY: No LMP recorded. Contraception: *** Menopausal hormone therapy: ***  Patient Active Problem List   Diagnosis Date Noted  . Vaginitis 01/01/2019  . Irregular menses 04/13/2018  . Headache disorder 03/26/2018  . Epigastric hernia 01/04/2018  . Tremor 06/16/2017  . Family history of headaches 07/13/2016  . Hx of hernia repair 03/11/2014    Past Medical History:  Diagnosis Date  . Family history of allergies   . Headache    migraines - 2x/mo.  milder HA more often  . History of headache   . history of heart murmur   . History of hypotension   . Migraine without aura     Past Surgical History:  Procedure Laterality Date  . FOOT SURGERY Left   . HERNIA REPAIR     abdominal hernia 2010 at [facility]  . TYMPANOSTOMY TUBE PLACEMENT     62 months of age    MEDS:   Current Outpatient Medications on File Prior to Visit  Medication Sig Dispense Refill  . fluticasone (FLONASE) 50 MCG/ACT nasal spray Place into the nose.    . levonorgestrel-ethinyl estradiol (ALESSE) 0.1-20 MG-MCG tablet Take 1 tablet by mouth daily. 28 tablet 11  . SUMAtriptan (IMITREX) 25 MG tablet TAKE 1 TABLET BY MOUTH AT ONSET OF HEADACHE MAY TAKE AN ADDITONAL TABLET EVERY 2 HOURS IF NO RELEIF MAX OF 3 TABS IN 24 HOURS 12 tablet 1  . terconazole (TERAZOL 7) 0.4 % vaginal cream Place 1 applicator vaginally at bedtime. 45 g 1   No current facility-administered medications on file prior to visit.    ALLERGIES: Patient has no known allergies.  Family History  Problem Relation Age of Onset  . Breast cancer Other        paternal great grandmother  . Hyperlipidemia Maternal Grandfather   . Heart disease Maternal Grandfather   . Hypertension Maternal Grandfather   . Diabetes Maternal Grandfather   . Hypertension Mother   . Hypertension Father   . Diabetes  Father   . Hypertension Maternal Grandmother   . Diabetes Maternal Grandmother   . Kidney disease Other        great grandfather    SH:  ***  Review of Systems  PHYSICAL EXAMINATION:    There were no vitals taken for this visit.    General appearance: alert, cooperative, no acute distress  CV:  {Exam; heart brief:31539} Lungs:  {pe lungs ob:314451::"clear to auscultation, no wheezes, rales or rhonchi, symmetric air entry"} Breasts: {Exam; breast:13139::"normal appearance, no masses or tenderness"} Abdomen: soft, non-tender; bowel sounds normal; no masses,  no organomegaly Lymph:  no inguinal LAD noted  Pelvic: External genitalia:  no lesions              Urethra:  normal appearing urethra with no masses, tenderness or lesions              Bartholins and Skenes: normal                 Vagina: normal appearing vagina               Cervix: {CHL AMB PHY EX CERVIX NORM DEFAULT:[phone]::"no lesions"}              Bimanual Exam:  Uterus:  {CHL AMB PHY EX UTERUS NORM DEFAULT:[id number]::"normal size, contour, position,  consistency, mobility, non-tender"}              Adnexa: {CHL AMB PHY EX ADNEXA NO MASS DEFAULT:[phone]::"no mass, fullness, tenderness"}               Chaperone, ***, CMA, was present for exam.  Assessment: ***  Plan: ***   {NUMBERS; -10-45 JOINT ROM:10287} minutes of total time was spent for this patient encounter, including preparation, face-to-face counseling with the patient and coordination of care, and documentation of the encounter.

## 2020-10-09 ENCOUNTER — Institutional Professional Consult (permissible substitution): Payer: BC Managed Care – PPO | Admitting: Nurse Practitioner

## 2020-10-09 ENCOUNTER — Ambulatory Visit: Payer: BC Managed Care – PPO | Admitting: Nurse Practitioner

## 2020-10-26 ENCOUNTER — Encounter: Payer: Self-pay | Admitting: Obstetrics and Gynecology

## 2021-03-25 ENCOUNTER — Ambulatory Visit (INDEPENDENT_AMBULATORY_CARE_PROVIDER_SITE_OTHER): Payer: BC Managed Care – PPO | Admitting: Nurse Practitioner

## 2021-03-25 ENCOUNTER — Telehealth: Payer: Self-pay

## 2021-03-25 ENCOUNTER — Other Ambulatory Visit: Payer: Self-pay

## 2021-03-25 ENCOUNTER — Encounter: Payer: Self-pay | Admitting: Nurse Practitioner

## 2021-03-25 VITALS — BP 100/66

## 2021-03-25 DIAGNOSIS — N750 Cyst of Bartholin's gland: Secondary | ICD-10-CM | POA: Diagnosis not present

## 2021-03-25 NOTE — Telephone Encounter (Signed)
Patient's mom called trying to schedule urgent appt for patient to check Bartholin cyst as she is in pain with it.  I spoke with TW who agreed to see her at 3pm today. Spoke with mom and scheduled visit.

## 2021-03-25 NOTE — Progress Notes (Signed)
   Acute Office Visit  Subjective:    Patient ID: Sydney Dunn, female    DOB: 04-23-2001, 20 y.o.   MRN: [id number]   HPI 20 y.o. presents today for right bartholin cyst. This is her second occurrence on the right side (12/05/2020 was initial). She also had one on the left 10/07/2020. Both times incision and drainage with Word catheter placement were done in the ER along with course of antibiotics. She felt like after right catheter was placed cyst did not ever fully go away but swelling and pain have progressed quickly over the last few days.    Review of Systems  Constitutional: Negative.   Genitourinary:  Positive for vaginal pain.       Abscess of right labia      Objective:    Physical Exam Constitutional:      Appearance: Normal appearance.  Genitourinary:    Labia:        Right: Tenderness present.      Comments: Large abscess of right labia, fluctuant, mild tenderness with palpation, no drainage. Consistent with bartholin abscess   BP 100/66   LMP 03/12/2021 (Approximate)  Wt Readings from Last 3 Encounters:  07/17/20 107 lb (48.5 kg)  06/26/20 108 lb (49 kg)  06/15/20 110 lb (49.9 kg)        Assessment & Plan:   Problem List Items Addressed This Visit   None Visit Diagnoses     Cyst of right Bartholin's gland    -  Primary      Plan: This is second ocurrence of bartholin abscess on right side so we discussed the option to drain and place Word catheter again versus performing marsupialization. Because abscess did not fully drain after last Word placement and has recurred she is agreeable to marsupialization. Plan of care discussed with Dr. Dellis Filbert and patient will return to office tomorrow for procedure. Recommend warm compresses and tylenol/Ibuprofen for pain until then and 800 mg Ibuprofen 1 hour prior to appointment. She is agreeable.      Tamela Gammon DNP, 3:19 PM 03/25/2021

## 2021-03-26 ENCOUNTER — Ambulatory Visit (INDEPENDENT_AMBULATORY_CARE_PROVIDER_SITE_OTHER): Payer: BC Managed Care – PPO | Admitting: Obstetrics & Gynecology

## 2021-03-26 ENCOUNTER — Encounter: Payer: Self-pay | Admitting: Obstetrics & Gynecology

## 2021-03-26 VITALS — BP 110/64

## 2021-03-26 DIAGNOSIS — N751 Abscess of Bartholin's gland: Secondary | ICD-10-CM

## 2021-03-26 NOTE — Progress Notes (Signed)
    Sydney Dunn [birth year] [id number]        20 y.o.  G0P0000 Single.  Studying in Dental hygiene.  Mother present to support.  RP: Recurrent Right Bartholin Gland Abscess for Marsupialization  HPI: Recurrent Right Bartholin Gland Abscess.  Painful/tender.  No drainage.  No redness of skin.  No fever.  Has had a Left Bartholin Gland abscess in the past as well, but not currently.   OB History  Gravida Para Term Preterm AB Living  0 0 0 0 0 0  SAB IAB Ectopic Multiple Live Births  0 0 0 0 0    Past medical history,surgical history, problem list, medications, allergies, family history and social history were all reviewed and documented in the EPIC chart.   Directed ROS with pertinent positives and negatives documented in the history of present illness/assessment and plan.  Exam:  Vitals:   03/26/21 1244  BP: 110/64   General appearance:  Normal  Marsupialization of recurrent Right Bartholin Gland Abscess:  Written informed consent obtained.  Betadine prep.  Local anesthesia with Lidocaine 1%.  Incision with scalpel of 1.5 cm on inner aspect of the Bartholin abscess at the site of the previous drainage just outside the Hymen.  Abundant drainage of pus.  Abscess completely drained and then cleaned with sterile water.  7 separate stitches around the incision to marsupialize, including the gland wall and the skin with Vicryl 4-0.  Good hemostasis.  Packing with a plain 1/4 inch pack.  Well tolerated.  No complication.   Assessment/Plan:  20 y.o. G0P0000   1. Abscess of right Bartholin's gland  Recurrent Bartholin Gland Abscess Marsupialization.  Counseling on marsupialization done.  Patient tolerated the procedure well with no complication.  Postprocedure precautions discussed.  Will remove the packing at 24 hours.  Princess Bruins MD, 1:17 PM 03/26/2021

## 2021-07-26 LAB — OB RESULTS CONSOLE ABO/RH: RH Type: POSITIVE

## 2021-07-26 LAB — OB RESULTS CONSOLE ANTIBODY SCREEN: Antibody Screen: NEGATIVE

## 2021-07-26 LAB — OB RESULTS CONSOLE RPR: RPR: NONREACTIVE

## 2021-07-26 LAB — OB RESULTS CONSOLE HIV ANTIBODY (ROUTINE TESTING): HIV: NONREACTIVE

## 2021-07-26 LAB — OB RESULTS CONSOLE HEPATITIS B SURFACE ANTIGEN: Hepatitis B Surface Ag: NEGATIVE

## 2021-07-26 LAB — OB RESULTS CONSOLE RUBELLA ANTIBODY, IGM: Rubella: IMMUNE

## 2021-07-26 LAB — OB RESULTS CONSOLE GC/CHLAMYDIA
Chlamydia: NEGATIVE
Neisseria Gonorrhea: NEGATIVE

## 2021-09-12 NOTE — L&D Delivery Note (Signed)
Delivery Note At 4:28 AM a viable and healthy female was delivered via Vaginal, Spontaneous (Presentation: Left Occiput Anterior).  APGAR: 9, 9; weight pending .   Placenta status: Spontaneous, Intact.  Cord: 3 vessels with the following complications: None.  Cord pH: N/A  Anesthesia: Epidural Episiotomy: None Lacerations:  2nd degree perineal Suture Repair: 3.0 vicryl Est. Blood Loss (mL): 75  Mom to postpartum.  Baby to Couplet care / Skin to Skin.  Meka Lewan A Shauntea Lok 02/06/2022, 5:02 AM

## 2022-01-18 LAB — OB RESULTS CONSOLE GBS: GBS: POSITIVE

## 2022-01-25 ENCOUNTER — Encounter (HOSPITAL_COMMUNITY): Payer: Self-pay

## 2022-01-25 ENCOUNTER — Inpatient Hospital Stay (HOSPITAL_COMMUNITY)
Admission: AD | Admit: 2022-01-25 | Discharge: 2022-01-27 | DRG: 832 | Disposition: A | Discharge status: Home / Self Care | Payer: BC Managed Care – PPO | Attending: Obstetrics and Gynecology | Admitting: Obstetrics and Gynecology

## 2022-01-25 ENCOUNTER — Inpatient Hospital Stay (HOSPITAL_COMMUNITY): Payer: BC Managed Care – PPO

## 2022-01-25 DIAGNOSIS — O99820 Streptococcus B carrier state complicating pregnancy: Secondary | ICD-10-CM | POA: Diagnosis present

## 2022-01-25 DIAGNOSIS — N132 Hydronephrosis with renal and ureteral calculous obstruction: Secondary | ICD-10-CM | POA: Diagnosis present

## 2022-01-25 DIAGNOSIS — N12 Tubulo-interstitial nephritis, not specified as acute or chronic: Secondary | ICD-10-CM | POA: Diagnosis present

## 2022-01-25 DIAGNOSIS — O99013 Anemia complicating pregnancy, third trimester: Secondary | ICD-10-CM | POA: Diagnosis present

## 2022-01-25 DIAGNOSIS — R509 Fever, unspecified: Secondary | ICD-10-CM

## 2022-01-25 DIAGNOSIS — O26833 Pregnancy related renal disease, third trimester: Secondary | ICD-10-CM | POA: Diagnosis present

## 2022-01-25 DIAGNOSIS — O2303 Infections of kidney in pregnancy, third trimester: Secondary | ICD-10-CM | POA: Diagnosis not present

## 2022-01-25 DIAGNOSIS — O26893 Other specified pregnancy related conditions, third trimester: Secondary | ICD-10-CM | POA: Diagnosis present

## 2022-01-25 DIAGNOSIS — N200 Calculus of kidney: Secondary | ICD-10-CM | POA: Diagnosis not present

## 2022-01-25 DIAGNOSIS — Z3A36 36 weeks gestation of pregnancy: Secondary | ICD-10-CM

## 2022-01-25 DIAGNOSIS — Z6791 Unspecified blood type, Rh negative: Secondary | ICD-10-CM

## 2022-01-25 DIAGNOSIS — O365930 Maternal care for other known or suspected poor fetal growth, third trimester, not applicable or unspecified: Secondary | ICD-10-CM | POA: Diagnosis present

## 2022-01-25 LAB — COMPREHENSIVE METABOLIC PANEL
ALT: 9 U/L (ref 0–44)
AST: 18 U/L (ref 15–41)
Albumin: 2.7 g/dL — ABNORMAL LOW (ref 3.5–5.0)
Alkaline Phosphatase: 205 U/L — ABNORMAL HIGH (ref 38–126)
Anion gap: 9 (ref 5–15)
BUN: <5 mg/dL — ABNORMAL LOW (ref 6–20)
CO2: 21 mmol/L — ABNORMAL LOW (ref 22–32)
Calcium: 8.5 mg/dL — ABNORMAL LOW (ref 8.9–10.3)
Chloride: 101 mmol/L (ref 98–111)
Creatinine, Ser: 0.72 mg/dL (ref 0.44–1.00)
GFR, Estimated: >60 mL/min (ref >60)
Glucose, Bld: 118 mg/dL — ABNORMAL HIGH (ref 70–99)
Potassium: 3.7 mmol/L (ref 3.5–5.1)
Sodium: 131 mmol/L — ABNORMAL LOW (ref 135–145)
Total Bilirubin: 1.2 mg/dL (ref 0.3–1.2)
Total Protein: 6.3 g/dL — ABNORMAL LOW (ref 6.5–8.1)

## 2022-01-25 LAB — URINALYSIS, ROUTINE W REFLEX MICROSCOPIC
Bilirubin Urine: NEGATIVE
Glucose, UA: NEGATIVE mg/dL
Hgb urine dipstick: NEGATIVE
Ketones, ur: NEGATIVE mg/dL
Nitrite: NEGATIVE
Protein, ur: NEGATIVE mg/dL
Specific Gravity, Urine: 1.003 — ABNORMAL LOW (ref 1.005–1.030)
pH: 7 (ref 5.0–8.0)

## 2022-01-25 LAB — CBC WITH DIFFERENTIAL/PLATELET
Abs Immature Granulocytes: 0.31 10*3/uL — ABNORMAL HIGH (ref 0.00–0.07)
Basophils Absolute: 0.1 10*3/uL (ref 0.0–0.1)
Basophils Relative: 0 %
Eosinophils Absolute: 0 10*3/uL (ref 0.0–0.5)
Eosinophils Relative: 0 %
HCT: 32.5 % — ABNORMAL LOW (ref 36.0–46.0)
Hemoglobin: 11 g/dL — ABNORMAL LOW (ref 12.0–15.0)
Immature Granulocytes: 1 %
Lymphocytes Relative: 9 %
Lymphs Abs: 2.4 10*3/uL (ref 0.7–4.0)
MCH: 30.6 pg (ref 26.0–34.0)
MCHC: 33.8 g/dL (ref 30.0–36.0)
MCV: 90.3 fL (ref 80.0–100.0)
Monocytes Absolute: 3.4 10*3/uL — ABNORMAL HIGH (ref 0.1–1.0)
Monocytes Relative: 13 %
Neutro Abs: 19.5 10*3/uL — ABNORMAL HIGH (ref 1.7–7.7)
Neutrophils Relative %: 77 %
Platelets: 281 10*3/uL (ref 150–400)
RBC: 3.6 MIL/uL — ABNORMAL LOW (ref 3.87–5.11)
RDW: 12.3 % (ref 11.5–15.5)
Smear Review: NORMAL
WBC: 25.6 10*3/uL — ABNORMAL HIGH (ref 4.0–10.5)
nRBC: 0 % (ref 0.0–0.2)

## 2022-01-25 MED ORDER — LACTATED RINGERS IV SOLN: Freq: Once | INTRAVENOUS | Status: AC

## 2022-01-25 MED ORDER — SODIUM CHLORIDE 0.9 % IV SOLN
2.0000 g | INTRAVENOUS | Status: DC
  Administered 2022-01-26 – 2022-01-27 (×2): 2 g via INTRAVENOUS
  Filled 2022-01-25 (×3): qty 20

## 2022-01-25 MED ORDER — ONDANSETRON HCL 4 MG/2ML IJ SOLN
4.0000 mg | Freq: Four times a day (QID) | INTRAMUSCULAR | Status: DC | PRN
  Administered 2022-01-26: 4 mg via INTRAVENOUS
  Filled 2022-01-25: qty 2

## 2022-01-25 MED ORDER — SODIUM CHLORIDE 0.9 % IV SOLN
1.0000 g | Freq: Once | INTRAVENOUS | Status: AC
  Administered 2022-01-26: 1 g via INTRAVENOUS
  Filled 2022-01-25: qty 10

## 2022-01-25 MED ORDER — CALCIUM CARBONATE ANTACID 500 MG PO CHEW: 2.0000 | CHEWABLE_TABLET | ORAL | Status: DC | PRN

## 2022-01-25 MED ORDER — ACETAMINOPHEN 325 MG PO TABS
650.0000 mg | ORAL_TABLET | ORAL | Status: DC | PRN
  Administered 2022-01-26: 650 mg via ORAL
  Filled 2022-01-25: qty 2

## 2022-01-25 MED ORDER — DOCUSATE SODIUM 100 MG PO CAPS
100.0000 mg | ORAL_CAPSULE | Freq: Every day | ORAL | Status: DC
  Administered 2022-01-26 – 2022-01-27 (×2): 100 mg via ORAL
  Filled 2022-01-25 (×2): qty 1

## 2022-01-25 MED ORDER — ONDANSETRON 4 MG PO TBDP: 4.0000 mg | ORAL_TABLET | Freq: Four times a day (QID) | ORAL | Status: DC | PRN

## 2022-01-25 MED ORDER — ACETAMINOPHEN 325 MG PO TABS
650.0000 mg | ORAL_TABLET | Freq: Once | ORAL | Status: AC
  Administered 2022-01-25: 650 mg via ORAL
  Filled 2022-01-25: qty 2

## 2022-01-25 MED ORDER — PRENATAL MULTIVITAMIN CH
1.0000 | ORAL_TABLET | Freq: Every day | ORAL | Status: DC
  Administered 2022-01-26 – 2022-01-27 (×2): 1 via ORAL
  Filled 2022-01-25 (×2): qty 1

## 2022-01-25 MED ORDER — LACTATED RINGERS IV SOLN: INTRAVENOUS | Status: DC

## 2022-01-25 MED ORDER — ZOLPIDEM TARTRATE 5 MG PO TABS
5.0000 mg | ORAL_TABLET | Freq: Every evening | ORAL | Status: DC | PRN
  Administered 2022-01-27: 5 mg via ORAL
  Filled 2022-01-25: qty 1

## 2022-01-25 NOTE — MAU Note (Signed)
.  Sydney Dunn is a 21 y.o. at 23w5dhere in MAU reporting fever tonight after being diagnosed at office today with a kidney infection. Reports R flank and R low back pain. Given 2 shots of Rocephin in office today and sent home on Amoxicillin which she has taken. States is feeling baby move but not as much as usual. Denies VB or LOF ?[facility] ?Onset of complaint: today ?Pain score: 8 ?Vitals:  ? 01/25/22 2124  ?BP: 123/72  ?Pulse: (!) 125  ?Resp: 18  ?Temp: (!) 101.9 ?F (38.8 ?C)  ?SpO2: 100%  ?   ?FHT:180 ?Lab orders placed from triage:   ?U/a ?

## 2022-01-25 NOTE — H&P (Signed)
Chief Complaint:  Fever ? ? Event Date/Time  ? First Provider Initiated Contact with Patient 01/25/22 2158   ? HPI: Sydney Dunn is a 21 y.o. G1P0000 at 63w5dho presents to maternity admissions reporting right flank pain and fever.  Was diagnosed with a kidney infection in office today, given 2 injections of Rocephin and PO antibiotics.  This evening, fever and pain got worse. .Sydney Dunn?She reports good fetal movement, denies LOF, vaginal bleeding, vaginal itching/burning, urinary symptoms, h/a, dizziness, n/v, diarrhea, constipation or fever/chills.  She denies headache, visual changes or RUQ abdominal pain. ? ?Fever  ?This is a new problem. The current episode started today. The maximum temperature noted was 101 to 101.9 F. The temperature was taken using an oral thermometer. Associated symptoms include urinary pain (right flank pain). Pertinent negatives include no abdominal pain, congestion, coughing, headaches or nausea. She has tried nothing for the symptoms.  ? ? ?RN Note: ?KARPI DIEBOLDis a 21y.o. at 351w5dere in MAU reporting fever tonight after being diagnosed at office today with a kidney infection. Reports R flank and R low back pain. Given 2 shots of Rocephin in office today and sent home on Amoxicillin which she has taken. States is feeling baby move but not as much as usual. Denies VB or LOF ?[facility] ?Onset of complaint: today ?Pain score: 8 ? ?Past Medical History: ?Past Medical History:  ?Diagnosis Date  ? Family history of allergies   ? Headache   ? migraines - 2x/mo.  milder HA more often  ? History of headache   ? history of heart murmur   ? History of hypotension   ? Migraine without aura   ? ? ?Past obstetric history: ?OB History  ?Gravida Para Term Preterm AB Living  ?1 0 0 0 0 0  ?SAB IAB Ectopic Multiple Live Births  ?0 0 0 0 0  ?  ?# Outcome Date GA Lbr Len/2nd Weight Sex Delivery Anes PTL Lv  ?1 Current           ? ? ?Past Surgical History: ?Past Surgical History:  ?Procedure  Laterality Date  ? FOOT SURGERY Left   ? HERNIA REPAIR    ? [facility] TYMPANOSTOMY TUBE PLACEMENT    ? 5 65onths of age  ? ? ?Family History: ?Family History  ?Problem Relation Age of Onset  ? Breast cancer Other   ?     paternal great grandmother  ? Hyperlipidemia Maternal Grandfather   ? Heart disease Maternal Grandfather   ? Hypertension Maternal Grandfather   ? Diabetes Maternal Grandfather   ? Hypertension Mother   ? Hypertension Father   ? Diabetes Father   ? Hypertension Maternal Grandmother   ? Diabetes Maternal Grandmother   ? Kidney disease Other   ?     great grandfather  ? ? ?Social History: ?Social History  ? ?Tobacco Use  ? Smoking status: Never  ? Smokeless tobacco: Never  ?Vaping Use  ? Vaping Use: Never used  ?Substance Use Topics  ? Alcohol use: No  ?  Alcohol/week: 0.0 standard drinks  ? Drug use: No  ? ? ?Allergies: No Known Allergies ? ?Meds:  ?Medications Prior to Admission  ?Medication Sig Dispense Refill Last Dose  ? amoxicillin (AMOXIL) 875 MG tablet Take 875 mg by mouth 2 (two) times daily.   01/25/2022 at 1830  ? ondansetron (ZOFRAN) 8 MG tablet Take 8 mg by mouth every 8 (  eight) hours as needed for nausea or vomiting.   01/25/2022 at 1830  ? prenatal vitamin w/FE, FA (PRENATAL 1 + 1) 27-1 MG TABS tablet Take 1 tablet by mouth daily at 12 noon.   01/25/2022 at 1830  ? fluticasone (FLONASE) 50 MCG/ACT nasal spray Place into the nose.     ? SUMAtriptan (IMITREX) 25 MG tablet Take by mouth.     ? ? ?I have reviewed patient's Past Medical Hx, Surgical Hx, Family Hx, Social Hx, medications and allergies.  ? ?ROS:  ?Review of Systems  ?Constitutional:  Positive for fever.  ?HENT:  Negative for congestion.   ?Respiratory:  Negative for cough.   ?Gastrointestinal:  Negative for abdominal pain and nausea.  ?Genitourinary:  Positive for dysuria (right flank pain).  ?Neurological:  Negative for headaches.  ?Other systems negative ? ?Physical Exam  ?Patient Vitals for the past 24  hrs: ? BP Temp Pulse Resp SpO2 Height Weight  ?01/25/22 2154 115/72 -- (!) 125 -- -- -- --  ?01/25/22 2124 123/72 (!) 101.9 ?F (38.8 ?C) (!) 125 18 100 % '5\' 2"'$  (1.575 m) 58.1 kg  ? ?Constitutional: Well-developed, well-nourished female in no acute distress.  ?Cardiovascular: normal rate and rhythm ?Respiratory: normal effort, clear to auscultation bilaterally ?GI: Abd soft, non-tender, gravid appropriate for gestational age.   No rebound or guarding. ?MS: Extremities nontender, no edema, normal ROM ?Neurologic: Alert and oriented x 4.  ?GU: Neg CVAT. ? ?PELVIC EXAM:  deferred ? ?FHT:  Baseline 155 , moderate variability, accelerations present, no decelerations ?Contractions: q 5 mins Irregular   ?  ?Labs: ?Results for orders placed or performed during the hospital encounter of 01/25/22 (from the past 24 hour(s))  ?Urinalysis, Routine w reflex microscopic Urine, Clean Catch     Status: Abnormal  ? Collection Time: 01/25/22  9:35 PM  ?Result Value Ref Range  ? Color, Urine YELLOW YELLOW  ? APPearance CLEAR CLEAR  ? Specific Gravity, Urine 1.003 (L) 1.005 - 1.030  ? pH 7.0 5.0 - 8.0  ? Glucose, UA NEGATIVE NEGATIVE mg/dL  ? Hgb urine dipstick NEGATIVE NEGATIVE  ? Bilirubin Urine NEGATIVE NEGATIVE  ? Ketones, ur NEGATIVE NEGATIVE mg/dL  ? Protein, ur NEGATIVE NEGATIVE mg/dL  ? Nitrite NEGATIVE NEGATIVE  ? Leukocytes,Ua LARGE (A) NEGATIVE  ? RBC / HPF 0-5 0 - 5 RBC/hpf  ? WBC, UA 0-5 0 - 5 WBC/hpf  ? Bacteria, UA RARE (A) NONE SEEN  ? Squamous Epithelial / LPF 0-5 0 - 5  ?CBC with Differential/Platelet     Status: Abnormal (Preliminary result)  ? Collection Time: 01/25/22  9:58 PM  ?Result Value Ref Range  ? WBC 25.6 (H) 4.0 - 10.5 K/uL  ? RBC 3.60 (L) 3.87 - 5.11 MIL/uL  ? Hemoglobin 11.0 (L) 12.0 - 15.0 g/dL  ? HCT 32.5 (L) 36.0 - 46.0 %  ? MCV 90.3 80.0 - 100.0 fL  ? MCH 30.6 26.0 - 34.0 pg  ? MCHC 33.8 30.0 - 36.0 g/dL  ? RDW 12.3 11.5 - 15.5 %  ? Platelets 281 150 - 400 K/uL  ? nRBC 0.0 0.0 - 0.2 %  ? Neutrophils  Relative % PENDING %  ? Neutro Abs PENDING 1.7 - 7.7 K/uL  ? Band Neutrophils PENDING %  ? Lymphocytes Relative PENDING %  ? Lymphs Abs PENDING 0.7 - 4.0 K/uL  ? Monocytes Relative PENDING %  ? Monocytes Absolute PENDING 0.1 - 1.0 K/uL  ? Eosinophils Relative PENDING %  ?  Eosinophils Absolute PENDING 0.0 - 0.5 K/uL  ? Basophils Relative PENDING %  ? Basophils Absolute PENDING 0.0 - 0.1 K/uL  ? WBC Morphology PENDING   ? RBC Morphology PENDING   ? Smear Review PENDING   ? Other PENDING %  ? nRBC PENDING 0 /100 WBC  ? Metamyelocytes Relative PENDING %  ? Myelocytes PENDING %  ? Promyelocytes Relative PENDING %  ? Blasts PENDING %  ? Immature Granulocytes PENDING %  ? Abs Immature Granulocytes PENDING 0.00 - 0.07 K/uL  ?Comprehensive metabolic panel     Status: Abnormal  ? Collection Time: 01/25/22  9:58 PM  ?Result Value Ref Range  ? Sodium 131 (L) 135 - 145 mmol/L  ? Potassium 3.7 3.5 - 5.1 mmol/L  ? Chloride 101 98 - 111 mmol/L  ? CO2 21 (L) 22 - 32 mmol/L  ? Glucose, Bld 118 (H) 70 - 99 mg/dL  ? BUN <5 (L) 6 - 20 mg/dL  ? Creatinine, Ser 0.72 0.44 - 1.00 mg/dL  ? Calcium 8.5 (L) 8.9 - 10.3 mg/dL  ? Total Protein 6.3 (L) 6.5 - 8.1 g/dL  ? Albumin 2.7 (L) 3.5 - 5.0 g/dL  ? AST 18 15 - 41 U/L  ? ALT 9 0 - 44 U/L  ? Alkaline Phosphatase 205 (H) 38 - 126 U/L  ? Total Bilirubin 1.2 0.3 - 1.2 mg/dL  ? GFR, Estimated >60 >60 mL/min  ? Anion gap 9 5 - 15  ?  ? ?Imaging:  ?US RENAL ? ?Result Date: 01/25/2022 ?CLINICAL DATA:  Renal infection, right flank pain, back pain, [redacted] weeks pregnant, fever EXAM: RENAL / URINARY TRACT ULTRASOUND COMPLETE COMPARISON:  None Available. FINDINGS: Right Kidney: Renal measurements: 11.6 x 6.8 x 5.8 cm = volume: 239.1 mL. Normal renal cortical echotexture. No renal mass. There is moderate right hydronephrosis proximal right hydroureter, which could be due to extrinsic compression from gravid uterus. Left Kidney: Renal measurements: 10.5 x 5.3 by 6.0 cm = volume: 172.5 mL. Normal renal cortical  echotexture. Mild left hydronephrosis and proximal left hydroureter. This could be due to extrinsic compression from the gravid uterus. 2 mm nonobstructing calculus lower pole left kidney. Bladder: Appears n

## 2022-01-25 NOTE — MAU Provider Note (Addendum)
Chief Complaint:  Fever ? ? Event Date/Time  ? First Provider Initiated Contact with Patient 01/25/22 2158   ? HPI: Sydney Dunn is a 21 y.o. G1P0000 at 61w5dho presents to maternity admissions reporting right flank pain and fever.  Was diagnosed with a kidney infection in office today, given 2 injections of Rocephin and PO antibiotics.  This evening, fever and pain got worse. .[name]?She reports good fetal movement, denies LOF, vaginal bleeding, vaginal itching/burning, urinary symptoms, h/a, dizziness, n/v, diarrhea, constipation or fever/chills.  She denies headache, visual changes or RUQ abdominal pain. ? ?Fever  ?This is a new problem. The current episode started today. The maximum temperature noted was 101 to 101.9 F. The temperature was taken using an oral thermometer. Associated symptoms include urinary pain (right flank pain). Pertinent negatives include no abdominal pain, congestion, coughing, headaches or nausea. She has tried nothing for the symptoms.  ? ? ?RN Note: ?KCATELIN MANTHEis a 21y.o. at 390w5dere in MAU reporting fever tonight after being diagnosed at office today with a kidney infection. Reports R flank and R low back pain. Given 2 shots of Rocephin in office today and sent home on Amoxicillin which she has taken. States is feeling baby move but not as much as usual. Denies VB or LOF ?[facility] ?Onset of complaint: today ?Pain score: 8 ? ?Past Medical History: ?Past Medical History:  ?Diagnosis Date  ? Family history of allergies   ? Headache   ? migraines - 2x/mo.  milder HA more often  ? History of headache   ? history of heart murmur   ? History of hypotension   ? Migraine without aura   ? ? ?Past obstetric history: ?OB History  ?Gravida Para Term Preterm AB Living  ?1 0 0 0 0 0  ?SAB IAB Ectopic Multiple Live Births  ?0 0 0 0 0  ?  ?# Outcome Date GA Lbr Len/2nd Weight Sex Delivery Anes PTL Lv  ?1 Current           ? ? ?Past Surgical History: ?Past Surgical History:  ?Procedure  Laterality Date  ? FOOT SURGERY Left   ? HERNIA REPAIR    ? [facility] TYMPANOSTOMY TUBE PLACEMENT    ? 5 82onths of age  ? ? ?Family History: ?Family History  ?Problem Relation Age of Onset  ? Breast cancer Other   ?     paternal great grandmother  ? Hyperlipidemia Maternal Grandfather   ? Heart disease Maternal Grandfather   ? Hypertension Maternal Grandfather   ? Diabetes Maternal Grandfather   ? Hypertension Mother   ? Hypertension Father   ? Diabetes Father   ? Hypertension Maternal Grandmother   ? Diabetes Maternal Grandmother   ? Kidney disease Other   ?     great grandfather  ? ? ?Social History: ?Social History  ? ?Tobacco Use  ? Smoking status: Never  ? Smokeless tobacco: Never  ?Vaping Use  ? Vaping Use: Never used  ?Substance Use Topics  ? Alcohol use: No  ?  Alcohol/week: 0.0 standard drinks  ? Drug use: No  ? ? ?Allergies: No Known Allergies ? ?Meds:  ?Medications Prior to Admission  ?Medication Sig Dispense Refill Last Dose  ? amoxicillin (AMOXIL) 875 MG tablet Take 875 mg by mouth 2 (two) times daily.   01/25/2022 at 1830  ? ondansetron (ZOFRAN) 8 MG tablet Take 8 mg by mouth every 8 (  eight) hours as needed for nausea or vomiting.   01/25/2022 at 1830  ? prenatal vitamin w/FE, FA (PRENATAL 1 + 1) 27-1 MG TABS tablet Take 1 tablet by mouth daily at 12 noon.   01/25/2022 at 1830  ? fluticasone (FLONASE) 50 MCG/ACT nasal spray Place into the nose.     ? SUMAtriptan (IMITREX) 25 MG tablet Take by mouth.     ? ? ?I have reviewed patient's Past Medical Hx, Surgical Hx, Family Hx, Social Hx, medications and allergies.  ? ?ROS:  ?Review of Systems  ?Constitutional:  Positive for fever.  ?HENT:  Negative for congestion.   ?Respiratory:  Negative for cough.   ?Gastrointestinal:  Negative for abdominal pain and nausea.  ?Genitourinary:  Positive for dysuria (right flank pain).  ?Neurological:  Negative for headaches.  ?Other systems negative ? ?Physical Exam  ?Patient Vitals for the past 24  hrs: ? BP Temp Pulse Resp SpO2 Height Weight  ?01/25/22 2154 115/72 -- (!) 125 -- -- -- --  ?01/25/22 2124 123/72 (!) 101.9 ?F (38.8 ?C) (!) 125 18 100 % '5\' 2"'$  (1.575 m) 58.1 kg  ? ?Constitutional: Well-developed, well-nourished female in no acute distress.  ?Cardiovascular: normal rate and rhythm ?Respiratory: normal effort, clear to auscultation bilaterally ?GI: Abd soft, non-tender, gravid appropriate for gestational age.   No rebound or guarding. ?MS: Extremities nontender, no edema, normal ROM ?Neurologic: Alert and oriented x 4.  ?GU: Neg CVAT. ? ?PELVIC EXAM:  deferred ? ?FHT:  Baseline 155 , moderate variability, accelerations present, no decelerations ?Contractions: q 5 mins Irregular   ?  ?Labs: ?Results for orders placed or performed during the hospital encounter of 01/25/22 (from the past 24 hour(s))  ?Urinalysis, Routine w reflex microscopic Urine, Clean Catch     Status: Abnormal  ? Collection Time: 01/25/22  9:35 PM  ?Result Value Ref Range  ? Color, Urine YELLOW YELLOW  ? APPearance CLEAR CLEAR  ? Specific Gravity, Urine 1.003 (L) 1.005 - 1.030  ? pH 7.0 5.0 - 8.0  ? Glucose, UA NEGATIVE NEGATIVE mg/dL  ? Hgb urine dipstick NEGATIVE NEGATIVE  ? Bilirubin Urine NEGATIVE NEGATIVE  ? Ketones, ur NEGATIVE NEGATIVE mg/dL  ? Protein, ur NEGATIVE NEGATIVE mg/dL  ? Nitrite NEGATIVE NEGATIVE  ? Leukocytes,Ua LARGE (A) NEGATIVE  ? RBC / HPF 0-5 0 - 5 RBC/hpf  ? WBC, UA 0-5 0 - 5 WBC/hpf  ? Bacteria, UA RARE (A) NONE SEEN  ? Squamous Epithelial / LPF 0-5 0 - 5  ?CBC with Differential/Platelet     Status: Abnormal (Preliminary result)  ? Collection Time: 01/25/22  9:58 PM  ?Result Value Ref Range  ? WBC 25.6 (H) 4.0 - 10.5 K/uL  ? RBC 3.60 (L) 3.87 - 5.11 MIL/uL  ? Hemoglobin 11.0 (L) 12.0 - 15.0 g/dL  ? HCT 32.5 (L) 36.0 - 46.0 %  ? MCV 90.3 80.0 - 100.0 fL  ? MCH 30.6 26.0 - 34.0 pg  ? MCHC 33.8 30.0 - 36.0 g/dL  ? RDW 12.3 11.5 - 15.5 %  ? Platelets 281 150 - 400 K/uL  ? nRBC 0.0 0.0 - 0.2 %  ? Neutrophils  Relative % PENDING %  ? Neutro Abs PENDING 1.7 - 7.7 K/uL  ? Band Neutrophils PENDING %  ? Lymphocytes Relative PENDING %  ? Lymphs Abs PENDING 0.7 - 4.0 K/uL  ? Monocytes Relative PENDING %  ? Monocytes Absolute PENDING 0.1 - 1.0 K/uL  ? Eosinophils Relative PENDING %  ?  Eosinophils Absolute PENDING 0.0 - 0.5 K/uL  ? Basophils Relative PENDING %  ? Basophils Absolute PENDING 0.0 - 0.1 K/uL  ? WBC Morphology PENDING   ? RBC Morphology PENDING   ? Smear Review PENDING   ? Other PENDING %  ? nRBC PENDING 0 /100 WBC  ? Metamyelocytes Relative PENDING %  ? Myelocytes PENDING %  ? Promyelocytes Relative PENDING %  ? Blasts PENDING %  ? Immature Granulocytes PENDING %  ? Abs Immature Granulocytes PENDING 0.00 - 0.07 K/uL  ?Comprehensive metabolic panel     Status: Abnormal  ? Collection Time: 01/25/22  9:58 PM  ?Result Value Ref Range  ? Sodium 131 (L) 135 - 145 mmol/L  ? Potassium 3.7 3.5 - 5.1 mmol/L  ? Chloride 101 98 - 111 mmol/L  ? CO2 21 (L) 22 - 32 mmol/L  ? Glucose, Bld 118 (H) 70 - 99 mg/dL  ? BUN <5 (L) 6 - 20 mg/dL  ? Creatinine, Ser 0.72 0.44 - 1.00 mg/dL  ? Calcium 8.5 (L) 8.9 - 10.3 mg/dL  ? Total Protein 6.3 (L) 6.5 - 8.1 g/dL  ? Albumin 2.7 (L) 3.5 - 5.0 g/dL  ? AST 18 15 - 41 U/L  ? ALT 9 0 - 44 U/L  ? Alkaline Phosphatase 205 (H) 38 - 126 U/L  ? Total Bilirubin 1.2 0.3 - 1.2 mg/dL  ? GFR, Estimated >60 >60 mL/min  ? Anion gap 9 5 - 15  ?  ? ?Imaging:  ?US RENAL ? ?Result Date: 01/25/2022 ?CLINICAL DATA:  Renal infection, right flank pain, back pain, [redacted] weeks pregnant, fever EXAM: RENAL / URINARY TRACT ULTRASOUND COMPLETE COMPARISON:  None Available. FINDINGS: Right Kidney: Renal measurements: 11.6 x 6.8 x 5.8 cm = volume: 239.1 mL. Normal renal cortical echotexture. No renal mass. There is moderate right hydronephrosis proximal right hydroureter, which could be due to extrinsic compression from gravid uterus. Left Kidney: Renal measurements: 10.5 x 5.3 by 6.0 cm = volume: 172.5 mL. Normal renal cortical  echotexture. Mild left hydronephrosis and proximal left hydroureter. This could be due to extrinsic compression from the gravid uterus. 2 mm nonobstructing calculus lower pole left kidney. Bladder: Appears n

## 2022-01-26 ENCOUNTER — Other Ambulatory Visit: Payer: Self-pay

## 2022-01-26 DIAGNOSIS — N12 Tubulo-interstitial nephritis, not specified as acute or chronic: Secondary | ICD-10-CM | POA: Diagnosis present

## 2022-01-26 DIAGNOSIS — O365930 Maternal care for other known or suspected poor fetal growth, third trimester, not applicable or unspecified: Secondary | ICD-10-CM | POA: Diagnosis present

## 2022-01-26 DIAGNOSIS — Z6791 Unspecified blood type, Rh negative: Secondary | ICD-10-CM | POA: Diagnosis not present

## 2022-01-26 DIAGNOSIS — O26833 Pregnancy related renal disease, third trimester: Secondary | ICD-10-CM | POA: Diagnosis present

## 2022-01-26 DIAGNOSIS — O99013 Anemia complicating pregnancy, third trimester: Secondary | ICD-10-CM | POA: Diagnosis present

## 2022-01-26 DIAGNOSIS — O26893 Other specified pregnancy related conditions, third trimester: Secondary | ICD-10-CM | POA: Diagnosis present

## 2022-01-26 DIAGNOSIS — O2303 Infections of kidney in pregnancy, third trimester: Principal | ICD-10-CM | POA: Diagnosis present

## 2022-01-26 DIAGNOSIS — R509 Fever, unspecified: Secondary | ICD-10-CM | POA: Diagnosis present

## 2022-01-26 DIAGNOSIS — N132 Hydronephrosis with renal and ureteral calculous obstruction: Secondary | ICD-10-CM | POA: Diagnosis present

## 2022-01-26 DIAGNOSIS — O99820 Streptococcus B carrier state complicating pregnancy: Secondary | ICD-10-CM | POA: Diagnosis present

## 2022-01-26 DIAGNOSIS — Z3A36 36 weeks gestation of pregnancy: Secondary | ICD-10-CM | POA: Diagnosis not present

## 2022-01-26 LAB — LACTIC ACID, PLASMA
Lactic Acid, Venous: 2 mmol/L (ref 0.5–1.9)
Lactic Acid, Venous: 1.3 mmol/L (ref 0.5–1.9)

## 2022-01-26 LAB — COMPREHENSIVE METABOLIC PANEL
ALT: 10 U/L (ref 0–44)
AST: 13 U/L — ABNORMAL LOW (ref 15–41)
Albumin: 2.2 g/dL — ABNORMAL LOW (ref 3.5–5.0)
Alkaline Phosphatase: 181 U/L — ABNORMAL HIGH (ref 38–126)
Anion gap: 7 (ref 5–15)
BUN: <5 mg/dL — ABNORMAL LOW (ref 6–20)
CO2: 22 mmol/L (ref 22–32)
Calcium: 8.4 mg/dL — ABNORMAL LOW (ref 8.9–10.3)
Chloride: 108 mmol/L (ref 98–111)
Creatinine, Ser: 0.53 mg/dL (ref 0.44–1.00)
GFR, Estimated: >60 mL/min (ref >60)
Glucose, Bld: 102 mg/dL — ABNORMAL HIGH (ref 70–99)
Potassium: 4.6 mmol/L (ref 3.5–5.1)
Sodium: 137 mmol/L (ref 135–145)
Total Bilirubin: 0.8 mg/dL (ref 0.3–1.2)
Total Protein: 5.4 g/dL — ABNORMAL LOW (ref 6.5–8.1)

## 2022-01-26 LAB — CBC
HCT: 29.6 % — ABNORMAL LOW (ref 36.0–46.0)
Hemoglobin: 9.9 g/dL — ABNORMAL LOW (ref 12.0–15.0)
MCH: 30.4 pg (ref 26.0–34.0)
MCHC: 33.4 g/dL (ref 30.0–36.0)
MCV: 90.8 fL (ref 80.0–100.0)
Platelets: 281 10*3/uL (ref 150–400)
RBC: 3.26 MIL/uL — ABNORMAL LOW (ref 3.87–5.11)
RDW: 12.4 % (ref 11.5–15.5)
WBC: 23.1 10*3/uL — ABNORMAL HIGH (ref 4.0–10.5)
nRBC: 0 % (ref 0.0–0.2)

## 2022-01-26 LAB — TYPE AND SCREEN
ABO/RH(D): O NEG
Antibody Screen: POSITIVE

## 2022-01-26 LAB — CBC WITH DIFFERENTIAL/PLATELET
Abs Immature Granulocytes: 0.19 10*3/uL — ABNORMAL HIGH (ref 0.00–0.07)
Basophils Absolute: 0 10*3/uL (ref 0.0–0.1)
Basophils Relative: 0 %
Eosinophils Absolute: 0.1 10*3/uL (ref 0.0–0.5)
Eosinophils Relative: 0 %
HCT: 31.8 % — ABNORMAL LOW (ref 36.0–46.0)
Hemoglobin: 10.5 g/dL — ABNORMAL LOW (ref 12.0–15.0)
Immature Granulocytes: 1 %
Lymphocytes Relative: 14 %
Lymphs Abs: 2.6 10*3/uL (ref 0.7–4.0)
MCH: 30.2 pg (ref 26.0–34.0)
MCHC: 33 g/dL (ref 30.0–36.0)
MCV: 91.4 fL (ref 80.0–100.0)
Monocytes Absolute: 2.2 10*3/uL — ABNORMAL HIGH (ref 0.1–1.0)
Monocytes Relative: 12 %
Neutro Abs: 13.4 10*3/uL — ABNORMAL HIGH (ref 1.7–7.7)
Neutrophils Relative %: 73 %
Platelets: 262 10*3/uL (ref 150–400)
RBC: 3.48 MIL/uL — ABNORMAL LOW (ref 3.87–5.11)
RDW: 12.6 % (ref 11.5–15.5)
WBC: 18.5 10*3/uL — ABNORMAL HIGH (ref 4.0–10.5)
nRBC: 0 % (ref 0.0–0.2)

## 2022-01-26 LAB — PATHOLOGIST SMEAR REVIEW

## 2022-01-26 MED ORDER — SODIUM CHLORIDE 0.9 % IV SOLN: INTRAVENOUS | Status: DC

## 2022-01-26 NOTE — Progress Notes (Signed)
Wendall Papa MD [place] to report decreased Temperature at 1655.   ?1700 Dr. Lanny Cramp returned call.  Reported Temperature of 34.5 Rectally .  Difficulty in getting oral Temp.  Patient denies any discomfort Also Reported Decrease in Blood Pressure.   ?Dr. Lanny Cramp to Order Labs at this time.   ?Warm blankets Placed on Patient.  ?

## 2022-01-26 NOTE — Progress Notes (Signed)
Correction on Temperature - 94.5 Rectally not 34.5 rectally  Blood Pressure reported at 98/68 .  ?

## 2022-01-26 NOTE — Progress Notes (Signed)
Feeling better, no pain, no n/v ?No ctx/lof/vb ? ?Patient Vitals for the past 24 hrs: ? BP Temp Temp src Pulse Resp SpO2 Height Weight  ?01/26/22 0730 105/60 98 ?F (36.7 ?C) Oral 93 18 98 % -- --  ?01/26/22 0414 108/66 97.7 ?F (36.5 ?C) Oral 85 19 99 % -- --  ?01/26/22 0023 (!) 105/58 98.4 ?F (36.9 ?C) Oral (!) 101 18 100 % -- --  ?01/25/22 2336 -- (!) 101.5 ?F (38.6 ?C) Oral -- -- -- -- --  ?01/25/22 2154 115/72 -- -- (!) 125 -- -- -- --  ?01/25/22 2124 123/72 (!) 101.9 ?F (38.8 ?C) -- (!) 125 18 100 % '5\' 2"'$  (1.575 m) 58.1 kg  ? ?A&ox3 ?Rrr ?Ctab ?Abd: soft,nt,gravid ?LE: no edema, nt bilat ? ? ?  Latest Ref Rng & Units 01/26/2022  ?  4:58 AM 01/25/2022  ?  9:58 PM 07/04/2019  ? 10:49 AM  ?CBC  ?WBC 4.0 - 10.5 K/uL 23.1   25.6   8.7    ?Hemoglobin 12.0 - 15.0 g/dL 9.9   11.0   12.9    ?Hematocrit 36.0 - 46.0 % 29.6   32.5   38.5    ?Platelets 150 - 400 K/uL 281   281   249    ? ? ? ?FHT: 110-150s overnight, nml variability, +accels, 1 decel about 1 min-isolated and back to baseline and accels  ?TOCO: occasional ctx ? ?A/P: iup at 36.6 wga ?Pyelonephritis - contin iv abx, ok for oral hydration, plan inpatient until 24-48 hr afebrile ?Fetal status reassuring - plan q 8 hr monitoring x1 hr ?Small renal stone, non-obstructing - keep increased fluids ?Anemia of pregnancy - plan iron q day ?Rh neg ?

## 2022-01-26 NOTE — Progress Notes (Signed)
Called Dr. Lanny Cramp and notified of Lactic Acid result of 2.0.  and Temperature rise to 97.5 orally.  Patient denies any pain or discomfort.  Dr. Lanny Cramp to Order Follow-up labs .Bear hugger continues at this time.  ?

## 2022-01-26 NOTE — H&P (Signed)
Sydney Dunn is a 21 y.o. G1P0000 at 27w6dgestation presents for complaint of new diagnosis of pyelonephritis at office today, given antibiotics, now feeling worse - fever/chils/sweats.  Temp 101.  Given 1g rocephin in office today and started augmentin.  She does also c/o right flank and low back pain, worse since this afternoon. No nausea. ?Denies lof/vb.  Says felt some tightening in MAU but only a few times and doesn't feel any longer. +FM though decreased. ? ?Antepartum course:  poor weight gain, SGA 16% (4'10") 34wga  ?[facility] ? ?See complete pre-natal records ? ?History ?OB History   ? ? Gravida  ?1  ? Para  ?0  ? Term  ?0  ? Preterm  ?0  ? AB  ?0  ? Living  ?0  ?  ? ? SAB  ?0  ? IAB  ?0  ? Ectopic  ?0  ? Multiple  ?0  ? Live Births  ?0  ?   ?  ?  ? ?Past Medical History:  ?Diagnosis Date  ? Family history of allergies   ? Headache   ? migraines - 2x/mo.  milder HA more often  ? History of headache   ? history of heart murmur   ? History of hypotension   ? Migraine without aura   ? ?Past Surgical History:  ?Procedure Laterality Date  ? FOOT SURGERY Left   ? HERNIA REPAIR    ? [facility] ? TYMPANOSTOM[facility] TUBE PLACEMENT    ? 570months of age  ? ?Family History: family history includes Breast cancer in an other family member; Diabetes in her father, maternal grandfather, and maternal grandmother; Heart disease in her maternal grandfather; Hyperlipidemia in her maternal grandfather; Hypertension in her father, maternal grandfather, maternal grandmother, and mother; Kidney disease in an other family member. ?Social History:  reports that she has never smoked. She has never used smokeless tobacco. She reports that she does not drink alcohol and does not use drugs. ? ?ROS: See above otherwise negative ? ?Prenatal labs: ? ?ABO, Rh:  rh neg ?Antibody:  neg ?Rubella:  immune ?RPR:   nr ?HBsAg:   neg ?HIV:  neg ?GBS:   positive ?1 hr Glucola: Normal ?Genetic  screening: Normal ?Anatomy [place] Normal ? ?Physical Exam:  ? ?  ?Blood pressure 115/72, pulse (!) 125, temperature (!) 101.5 ?F (38.6 ?C), temperature source Oral, resp. rate 18, height '5\' 2"'$  (1.575 m), weight 58.1 kg, last menstrual period 03/12/2021, SpO2 100 %. ?A&O x 3 ?HEENT: Normal ?Lungs: CTAB ?CV:  tachy, rr ?Abdominal: Soft, Non-tender, and Gravid ?Lower Extremities: Non-edematous, Non-tender ? ?Pelvic Exam: ? ?     ?Labs: ? ?CBC:  ?Lab Results  ?Component Value Date  ? WBC 25.6 (H) 01/25/2022  ? RBC 3.60 (L) 01/25/2022  ? HGB 11.0 (L) 01/25/2022  ? HCT 32.5 (L) 01/25/2022  ? MCV 90.3 01/25/2022  ? MCH 30.6 01/25/2022  ? MCHC 33.8 01/25/2022  ? RDW 12.3 01/25/2022  ? PLT 281 01/25/2022  ? ?CMP:  ?Lab Results  ?Component Value Date  ? NA 131 (L) 01/25/2022  ? K 3.7 01/25/2022  ? CL 101 01/25/2022  ? CO2 21 (L) 01/25/2022  ? GLUCOSE 118 (H) 01/25/2022  ? BUN <5 (L) 01/25/2022  ? CREATININE 0.72 01/25/2022  ? CALCIUM 8.5 (L) 01/25/2022  ? PROT 6.3 (L) 01/25/2022  ? AST 18 01/25/2022  ? ALT 9 01/25/2022  ? ALBUMIN  2.7 (L) 01/25/2022  ? ALKPHOS 205 (H) 01/25/2022  ? BILITOT 1.2 01/25/2022  ? GFRNONAA >60 01/25/2022  ? GFRAA 150 07/04/2019  ? ANIONGAP 9 01/25/2022  ? ?Urine: ?Lab Results  ?Component Value Date  ? COLORURINE YELLOW 01/25/2022  ? APPEARANCEUR CLEAR 01/25/2022  ? LABSPEC 1.003 (L) 01/25/2022  ? PHURINE 7.0 01/25/2022  ? GLUCOSEU NEGATIVE 01/25/2022  ? [place] NEGATIVE 01/25/2022  ? [place] NEGATIVE 01/25/2022  ? [place] NEGATIVE 01/25/2022  ? PROTEINUR NEGATIVE 01/25/2022  ? NITRITE NEGATIVE 01/25/2022  ? LEUKOCYTESUR LARGE (A) 01/25/2022  ? ? ? ?Prenatal Transfer Tool  ?Maternal Diabetes: No ?Genetic Screening: Normal ?Maternal Ultrasounds/Referrals: Normal ?Fetal Ultrasounds or other Referrals:  None ?Maternal Substance Abuse:  No ?Significant Maternal Medications:  None ?Significant Maternal Lab Results: Group B Strep positive ? ?Renal u/s:  ?Imaging:  ?US RENAL ?  ?Result Date:  01/25/2022 ?CLINICAL DATA:  Renal infection, right flank pain, back pain, [redacted] weeks pregnant, fever EXAM: RENAL / URINARY TRACT ULTRASOUND COMPLETE COMPARISON:  None Available. FINDINGS: Right Kidney: Renal measurements: 11.6 x 6.8 x 5.8 cm = volume: 239.1 mL. Normal renal cortical echotexture. No renal mass. There is moderate right hydronephrosis proximal right hydroureter, which could be due to extrinsic compression from gravid uterus. Left Kidney: Renal measurements: 10.5 x 5.3 by 6.0 cm = volume: 172.5 mL. Normal renal cortical echotexture. Mild left hydronephrosis and proximal left hydroureter. This could be due to extrinsic compression from the gravid uterus. 2 mm nonobstructing calculus lower pole left kidney. Bladder: Appears normal for degree of bladder distention. Other: None.  ? ?IMPRESSION: 1. Bilateral hydronephrosis, right greater than left. This could be due to extrinsic compression on the distal ureters by the gravid uterus. 2. Nonobstructing 2 mm left renal calculus  ? ?peer Electronically Signed   By: Randa Ngo M.D.   On: 01/25/2022 22:47   ? ?FHT: 150s, nml variability, +accels, couple late decels that resolved <29mn ?TOCO: q 5 min, irreg ? ?Assessment/Plan:  21y.o. G1P0000 at 380w6destation  ? ?Pyelonephritis - admit for iv abx, ivf, plan admit until 24-48 hr afebrile, zofran prn  ?Left 31m70mon-obstructing stone - encourage hydration ?Fetal status reassuring - plan continuous monitoring for now, then likely change to q8r later in am ?SGA ?Poor maternal weight gain ?Ctx - mild, will follow for worsening/labor, suspect improvement with hydration ? ?SusCharyl Bigger/17/2023, 12:06 AM ? ?

## 2022-01-27 MED ORDER — ACETAMINOPHEN 325 MG PO TABS
650.0000 mg | ORAL_TABLET | ORAL | Status: DC | PRN
Start: 2022-01-27 — End: 2022-02-07

## 2022-01-27 NOTE — Plan of Care (Signed)
Patient to be discharged home with printed instructions. No concerns noted. Toya Smothers, RN

## 2022-01-27 NOTE — Progress Notes (Signed)
Sydney Dunn 21 y.o. G1P0000 at 80w0dHD#2 admitted with pyelonephritis  S: Patient feeling better this morning compared to yesterday. States right sided back pain much improved. Does endorse subjective chills/sweats over night, but no fevers. N/V has improved and has been able to keep down some solids, hydrating well. No urinary symptoms. Has not had a BM since Monday but denies feeling constipated. Reports good FM. Denies any regular CTXs. No VB or LOF. Patient's mom is at bedside this morning, providing support. Patient's mom voices concern about how little her daughter has been eating.  O: Vitals:   01/26/22 2323 01/27/22 0358 01/27/22 0754 01/27/22 1241  BP: (!) 99/52 (!) 94/46 (!) 90/57 (!) 98/56  Pulse: 75 73 61 74  Resp: '16 17 18 16  '$ Temp: 98.2 F (36.8 C) 98.3 F (36.8 C) 97.9 F (36.6 C) 97.7 F (36.5 C)  TempSrc: Oral Oral Oral Oral  SpO2: 100% 100% 98% 99%  Weight:      Height:       Physical Exam: -General: AAO, NAD -Heart: regular rate -Respiratory: normal effort -Abdomen: gravid uterus no fundal tenderness, neg CVA tenderness b/l -Extremities: neg LE edema     Latest Ref Rng & Units 01/26/2022    5:15 PM 01/26/2022    4:58 AM 01/25/2022    9:58 PM  CBC  WBC 4.0 - 10.5 K/uL 18.5   23.1   25.6    Hemoglobin 12.0 - 15.0 g/dL 10.5   9.9   11.0    Hematocrit 36.0 - 46.0 % 31.8   29.6   32.5    Platelets 150 - 400 K/uL 262   281   281        Latest Ref Rng & Units 01/26/2022    5:15 PM 01/25/2022    9:58 PM 07/04/2019   10:49 AM  CMP  Glucose 70 - 99 mg/dL 102   118   89    BUN 6 - 20 mg/dL <5   <5   8    Creatinine 0.44 - 1.00 mg/dL 0.53   0.72   0.64    Sodium 135 - 145 mmol/L 137   131   139    Potassium 3.5 - 5.1 mmol/L 4.6   3.7   4.5    Chloride 98 - 111 mmol/L 108   101   104    CO2 22 - 32 mmol/L '22   21   25    '$ Calcium 8.9 - 10.3 mg/dL 8.4   8.5   9.5    Total Protein 6.5 - 8.1 g/dL 5.4   6.3   6.9    Total Bilirubin 0.3 - 1.2 mg/dL 0.8   1.2    0.3    Alkaline Phos 38 - 126 U/L 181   205   60    AST 15 - 41 U/L '13   18   11    '$ ALT 0 - 44 U/L '10   9   6     '$ Lactic Acid, Venous    Component Value Date/Time   LATICACIDVEN 1.3 01/26/2022 2134   Fetal Monitoring:   5/17 PM NST: reactive baseline 140 bpm mod var +accels, -decels. Toco: acontractile 5/18 AM NST: reactive baseline 120 bpm mod var +accels, -decels. Toco: acontractile  A/P: KVernona Rieger89y.o. G1P0000 at 365w0dD#2 admitted with pyelonephritis, now clinically improving  Right Pyelonephritis, Third Trimester -S/p 2 doses of Rocephin, due for third dose this  afternoon -Afebrile since admission 5/17 at [place]. However patient did have low temperatures last night as low as 94.41F- did improve with use of Bear Hugger warmer, stable this morning ranging between 97-72F and tachycardia has resolved -Down-trending WBC. Lactic acid was checked when patient's temperature was persistently low, 5/17 at 1715 lactic acid was 2.0, came down to 1.3 on recheck 3 hours later -No CVA tenderness on exam, overall clinically improving -We reviewed inpatient management for pyelo in pregnancy for at least 24-48 hours afebrile. We discussed possibility of going home today with oral antibiotics since >24hrs afebrile vs tomorrow after the 48hr mark. Patient and her mom do voice concern about going home too quickly and want to see how she feels over the course of the day before making decision -Will continue IVF hydration, encourage PO, and Rocephin abx therapy while inpatient -Ucx from office pending. Patient has Augmentin course at home, previously sent unless culture shows resistance Antepartum Care -NST q shift- reassuring fetal status -PNV daily -Regular diet and bowel regimen PRN Disposition -May consider DC home later this evening vs tomorrow morning   Lear Carstens A Crystal Ellwood 01/27/22 12:43 PM

## 2022-01-27 NOTE — Discharge Summary (Signed)
Physician Discharge Summary  Patient ID: Sydney Dunn MRN: [id number] DOB/AGE: 01-02-2001 21 y.o.  Admit date: 01/25/2022 Discharge date: 01/27/2022  Admission Diagnoses: Pyelonephritis in pregnancy, third trimester  Discharge Diagnoses:  Principal Problem:   Pyelonephritis Active Problems:   Pyelonephritis affecting pregnancy in third trimester   Discharged Condition: good  Hospital Course: 01/26/22 HD#1 Patient presented with new onset fever and right sided flank pain. Work up consistent with pyelonephritis and admitted for inpatient management with IV antibiotics and IV fluid hydration. Patient received IV Rocephin, IVF, vital sign monitoring, and fetal monitoring. Patient remained afebrile throughout the day but did have low temperatures in the afternoon which responded to external warming measures. Serial labs done which showed down trending WBC and lactic acid levels. Fetal status reassuring throughout. 01/27/22: HD#2 Patient clinically improving. Remaining afebrile for >24hrs, able to tolerate PO, and CVA tenderness resolved. Discharged home with PO antibiotic course (previously sent from office Augmentin) and close outpatient follow up  Consults: None  Significant Diagnostic Studies: labs:     Latest Ref Rng & Units 01/26/2022    5:15 PM 01/26/2022    4:58 AM 01/25/2022    9:58 PM  CBC  WBC 4.0 - 10.5 K/uL 18.5   23.1   25.6    Hemoglobin 12.0 - 15.0 g/dL 10.5   9.9   11.0    Hematocrit 36.0 - 46.0 % 31.8   29.6   32.5    Platelets 150 - 400 K/uL 262   281   281     Lactic Acid, Venous    Component Value Date/Time   LATICACIDVEN 1.3 01/26/2022 2134    and radiology: Ultrasound: Renal study: IMPRESSION IMPRESSION: 1. Bilateral hydronephrosis, right greater than left. This could be due to extrinsic compression on the distal ureters by the gravid uterus. 2. Nonobstructing 2 mm left renal calculus peer  Treatments: IV hydration and antibiotics:  ceftriaxone  Discharge Exam: Blood pressure (!) 98/56, pulse 74, temperature 97.7 F (36.5 C), temperature source Oral, resp. rate 16, height '5\' 2"'$  (1.575 m), weight 58.1 kg, last menstrual period 03/12/2021, SpO2 99 %. General appearance: alert and no distress Back: symmetric, no curvature. ROM normal. No CVA tenderness. Resp: clear to auscultation bilaterally Cardio: regular rate and rhythm Extremities: no edema, redness or tenderness in the calves or thighs Skin: Skin color, texture, turgor normal. No rashes or lesions  Disposition: Discharge disposition: 01-Home or Self Care       Discharge Instructions     Activity as tolerated - No restrictions   Complete by: As directed    Call MD for:   Complete by: As directed    Call MD for:  difficulty breathing, headache or visual disturbances   Complete by: As directed    Call MD for:  extreme fatigue   Complete by: As directed    Call MD for:  hives   Complete by: As directed    Call MD for:  persistant dizziness or light-headedness   Complete by: As directed    Call MD for:  persistant nausea and vomiting   Complete by: As directed    Call MD for:  severe uncontrolled pain   Complete by: As directed    Call MD for:  temperature >100.4   Complete by: As directed    Diet general   Complete by: As directed    Discharge instructions   Complete by: As directed    Patient instructed to start PO Antibiotic Course as  previously prescribed with Augmentin 875 mg BID x 7 days. Outpatient urine culture results pending. Call parameters reviewed and patient to follow up in office as scheduled on 02/02/22 or sooner as needed.      Allergies as of 01/27/2022   No Known Allergies      Medication List     TAKE these medications    acetaminophen 325 MG tablet Commonly known as: TYLENOL Take 2 tablets (650 mg total) by mouth every 4 (four) hours as needed (for pain scale < 4  OR  temperature  >/=  100.5 F).   amoxicillin 875 MG  tablet Commonly known as: AMOXIL Take 875 mg by mouth 2 (two) times daily.   fluticasone 50 MCG/ACT nasal spray Commonly known as: FLONASE Place into the nose.   ondansetron 8 MG tablet Commonly known as: ZOFRAN Take 8 mg by mouth every 8 (eight) hours as needed for nausea or vomiting.   prenatal vitamin w/FE, FA 27-1 MG Tabs tablet Take 1 tablet by mouth daily at 12 noon.   SUMAtriptan 25 MG tablet Commonly known as: IMITREX Take by mouth.      Patient instructed to start PO Augmentin 7 day course as prescribed in the office upon discharge. Will follow up urine culture results sent from the office 5/16 and amend antibiotic course if indicated. Follow up in office as scheduled next week or sooner as indicated.    Signed: Jakeel Starliper A Lua Feng 01/27/2022, 3:27 PM

## 2022-02-03 ENCOUNTER — Other Ambulatory Visit: Payer: Self-pay | Admitting: Obstetrics and Gynecology

## 2022-02-04 ENCOUNTER — Telehealth (HOSPITAL_COMMUNITY): Payer: Self-pay | Admitting: *Deleted

## 2022-02-04 ENCOUNTER — Encounter (HOSPITAL_COMMUNITY): Payer: Self-pay | Admitting: *Deleted

## 2022-02-04 NOTE — Telephone Encounter (Signed)
Preadmission screen  

## 2022-02-05 ENCOUNTER — Inpatient Hospital Stay (HOSPITAL_COMMUNITY): Payer: BC Managed Care – PPO

## 2022-02-05 ENCOUNTER — Inpatient Hospital Stay (HOSPITAL_COMMUNITY): Payer: BC Managed Care – PPO | Admitting: Anesthesiology

## 2022-02-05 ENCOUNTER — Encounter (HOSPITAL_COMMUNITY): Payer: Self-pay | Admitting: Obstetrics and Gynecology

## 2022-02-05 ENCOUNTER — Inpatient Hospital Stay (HOSPITAL_COMMUNITY)
Admission: AD | Admit: 2022-02-05 | Discharge: 2022-02-07 | DRG: 805 | Disposition: A | Discharge status: Home / Self Care | Payer: BC Managed Care – PPO | Attending: Obstetrics and Gynecology | Admitting: Obstetrics and Gynecology

## 2022-02-05 DIAGNOSIS — K831 Obstruction of bile duct: Secondary | ICD-10-CM | POA: Diagnosis present

## 2022-02-05 DIAGNOSIS — Z6791 Unspecified blood type, Rh negative: Secondary | ICD-10-CM | POA: Diagnosis present

## 2022-02-05 DIAGNOSIS — B951 Streptococcus, group B, as the cause of diseases classified elsewhere: Secondary | ICD-10-CM | POA: Diagnosis present

## 2022-02-05 DIAGNOSIS — O26649 Intrahepatic cholestasis of pregnancy, unspecified trimester: Secondary | ICD-10-CM | POA: Diagnosis present

## 2022-02-05 DIAGNOSIS — Z3490 Encounter for supervision of normal pregnancy, unspecified, unspecified trimester: Principal | ICD-10-CM | POA: Diagnosis present

## 2022-02-05 DIAGNOSIS — O26893 Other specified pregnancy related conditions, third trimester: Secondary | ICD-10-CM | POA: Diagnosis present

## 2022-02-05 DIAGNOSIS — O365930 Maternal care for other known or suspected poor fetal growth, third trimester, not applicable or unspecified: Secondary | ICD-10-CM | POA: Diagnosis present

## 2022-02-05 DIAGNOSIS — Z3A38 38 weeks gestation of pregnancy: Secondary | ICD-10-CM

## 2022-02-05 DIAGNOSIS — O99824 Streptococcus B carrier state complicating childbirth: Secondary | ICD-10-CM | POA: Diagnosis present

## 2022-02-05 DIAGNOSIS — O2662 Liver and biliary tract disorders in childbirth: Secondary | ICD-10-CM | POA: Diagnosis present

## 2022-02-05 DIAGNOSIS — O2303 Infections of kidney in pregnancy, third trimester: Secondary | ICD-10-CM | POA: Diagnosis present

## 2022-02-05 LAB — TYPE AND SCREEN
ABO/RH(D): O NEG
Antibody Screen: POSITIVE

## 2022-02-05 LAB — CBC
HCT: 32.7 % — ABNORMAL LOW (ref 36.0–46.0)
Hemoglobin: 10.6 g/dL — ABNORMAL LOW (ref 12.0–15.0)
MCH: 29.3 pg (ref 26.0–34.0)
MCHC: 32.4 g/dL (ref 30.0–36.0)
MCV: 90.3 fL (ref 80.0–100.0)
Platelets: 393 10*3/uL (ref 150–400)
RBC: 3.62 MIL/uL — ABNORMAL LOW (ref 3.87–5.11)
RDW: 12.7 % (ref 11.5–15.5)
WBC: 13.8 10*3/uL — ABNORMAL HIGH (ref 4.0–10.5)
nRBC: 0 % (ref 0.0–0.2)

## 2022-02-05 MED ORDER — DIPHENHYDRAMINE HCL 50 MG/ML IJ SOLN: 12.5000 mg | INTRAMUSCULAR | Status: DC | PRN

## 2022-02-05 MED ORDER — FENTANYL-BUPIVACAINE-NACL 0.5-0.125-0.9 MG/250ML-% EP SOLN
12.0000 mL/h | EPIDURAL | Status: DC | PRN
  Administered 2022-02-05: 12 mL/h via EPIDURAL
  Filled 2022-02-05: qty 250

## 2022-02-05 MED ORDER — SODIUM CHLORIDE 0.9 % IV SOLN
5.0000 10*6.[IU] | Freq: Once | INTRAVENOUS | Status: AC
  Administered 2022-02-05: 5 10*6.[IU] via INTRAVENOUS
  Filled 2022-02-05: qty 5

## 2022-02-05 MED ORDER — PENICILLIN G POT IN DEXTROSE 60000 UNIT/ML IV SOLN
3.0000 10*6.[IU] | INTRAVENOUS | Status: DC
  Administered 2022-02-05 – 2022-02-06 (×2): 3 10*6.[IU] via INTRAVENOUS
  Filled 2022-02-05 (×2): qty 50

## 2022-02-05 MED ORDER — FENTANYL CITRATE (PF) 100 MCG/2ML IJ SOLN
INTRAMUSCULAR | Status: AC
  Filled 2022-02-05: qty 2

## 2022-02-05 MED ORDER — OXYTOCIN BOLUS FROM INFUSION
333.0000 mL | Freq: Once | INTRAVENOUS | Status: AC
Start: 2022-02-05 — End: 2022-02-06
  Administered 2022-02-06: 333 mL via INTRAVENOUS

## 2022-02-05 MED ORDER — OXYTOCIN-SODIUM CHLORIDE 30-0.9 UT/500ML-% IV SOLN
1.0000 m[IU]/min | INTRAVENOUS | Status: DC
  Administered 2022-02-05: 1 m[IU]/min via INTRAVENOUS

## 2022-02-05 MED ORDER — SOD CITRATE-CITRIC ACID 500-334 MG/5ML PO SOLN: 30.0000 mL | ORAL | Status: DC | PRN

## 2022-02-05 MED ORDER — LACTATED RINGERS IV SOLN: 500.0000 mL | INTRAVENOUS | Status: DC | PRN

## 2022-02-05 MED ORDER — PHENYLEPHRINE 80 MCG/ML (10ML) SYRINGE FOR IV PUSH (FOR BLOOD PRESSURE SUPPORT)
80.0000 ug | PREFILLED_SYRINGE | INTRAVENOUS | Status: DC | PRN
  Administered 2022-02-06: 80 ug via INTRAVENOUS

## 2022-02-05 MED ORDER — FENTANYL CITRATE (PF) 100 MCG/2ML IJ SOLN
100.0000 ug | INTRAMUSCULAR | Status: DC | PRN
  Administered 2022-02-05: 100 ug via INTRAVENOUS
  Filled 2022-02-05: qty 2

## 2022-02-05 MED ORDER — LIDOCAINE HCL (PF) 1 % IJ SOLN
INTRAMUSCULAR | Status: DC | PRN
Start: 2022-02-05 — End: 2022-02-06
  Administered 2022-02-05: 6 mL via EPIDURAL
  Administered 2022-02-05: 4 mL via EPIDURAL

## 2022-02-05 MED ORDER — OXYTOCIN-SODIUM CHLORIDE 30-0.9 UT/500ML-% IV SOLN: 1.0000 m[IU]/min | INTRAVENOUS | Status: DC

## 2022-02-05 MED ORDER — PHENYLEPHRINE 80 MCG/ML (10ML) SYRINGE FOR IV PUSH (FOR BLOOD PRESSURE SUPPORT)
80.0000 ug | PREFILLED_SYRINGE | INTRAVENOUS | Status: DC | PRN
  Filled 2022-02-05: qty 10

## 2022-02-05 MED ORDER — ONDANSETRON HCL 4 MG/2ML IJ SOLN: 4.0000 mg | Freq: Four times a day (QID) | INTRAMUSCULAR | Status: DC | PRN

## 2022-02-05 MED ORDER — LACTATED RINGERS IV SOLN: INTRAVENOUS | Status: DC

## 2022-02-05 MED ORDER — LACTATED RINGERS IV SOLN: 500.0000 mL | Freq: Once | INTRAVENOUS | Status: DC

## 2022-02-05 MED ORDER — TERBUTALINE SULFATE 1 MG/ML IJ SOLN: 0.2500 mg | Freq: Once | INTRAMUSCULAR | Status: DC | PRN

## 2022-02-05 MED ORDER — EPHEDRINE 5 MG/ML INJ: 10.0000 mg | INTRAVENOUS | Status: DC | PRN

## 2022-02-05 MED ORDER — LIDOCAINE HCL (PF) 1 % IJ SOLN
30.0000 mL | INTRAMUSCULAR | Status: DC | PRN
Start: 2022-02-05 — End: 2022-02-06

## 2022-02-05 MED ORDER — OXYTOCIN-SODIUM CHLORIDE 30-0.9 UT/500ML-% IV SOLN
2.5000 [IU]/h | INTRAVENOUS | Status: DC
  Filled 2022-02-05: qty 500

## 2022-02-05 MED ORDER — ACETAMINOPHEN 325 MG PO TABS: 650.0000 mg | ORAL_TABLET | ORAL | Status: DC | PRN

## 2022-02-05 NOTE — Anesthesia Procedure Notes (Signed)
Epidural Patient location during procedure: OB Start time: 02/05/2022 10:26 PM End time: 02/05/2022 10:37 PM  Staffing Anesthesiologist: Lidia Collum, MD Performed: anesthesiologist   Preanesthetic Checklist Completed: patient identified, IV checked, risks and benefits discussed, monitors and equipment checked, pre-op evaluation and timeout performed  Epidural Patient position: sitting Prep: DuraPrep Patient monitoring: heart rate, continuous pulse ox and blood pressure Approach: midline Location: L3-L4 Injection technique: LOR air  Needle:  Needle type: Tuohy  Needle gauge: 17 G Needle length: 9 cm Needle insertion depth: 6 cm Catheter type: closed end flexible Catheter size: 19 Gauge Catheter at skin depth: 11 cm Test dose: negative  Assessment Events: blood not aspirated, injection not painful, no injection resistance, no paresthesia and negative IV test  Additional Notes Reason for block:procedure for pain

## 2022-02-05 NOTE — Progress Notes (Signed)
Labor Progress Note  S/O: Pt reports some tightening with ctxs but remains comfortable  Vitals:   02/05/22 1930 02/05/22 1947  BP: 112/71   Pulse: 71   Resp: 18   Temp: 98.3 F (36.8 C)   SpO2:  100%    SVE: 2/80/-1  EFM: Cat I baseline 125 bpm mod var +accels, -decels Toco: ctxs irregular q 2-5 min  A/P: 20Y G1P0 @ 38.2 IOL IHCP and SGA GBS+/RH-  -IOL: Currently on Pitocin of 3U, pt consented for AROM- performed for clear fluid. Will hold Pitocin at 3 for the next 20-30 min and monitor ctxs, then titrate Pitocin 1x1 per protocol -GBS+ adequately treated prior to AROM, cont PCN q4hr  -Cont EFM/Toco- Cat I -Pt may have epidural on request  -Routine intrapartum care -Anticipate SVD  Sydney Dunn A Ayame Rena 02/05/22 7:58 PM

## 2022-02-05 NOTE — H&P (Signed)
Sydney Dunn is a 21 y.o. female G1P0000 41w2dpresenting for IOL for IHC.   Pregnancy dated by LMP c/w 8w sono. [facility] CP sono was significant for enlarged 7 cm simple right ovarian cyst which resolved by the second trimester. Routine prenatal labs showed varicella non-immune but otherwise WNL. Was treated for asymptomatic bacteriuria with initial prenatal labs as well, with negative TOC. Patient had low risk [employer] NIPS, negative AFP1 screen, and normal fetal anatomy scan. Patient did struggle with nausea and vomiting in the first and second trimesters. She never lost significant weight but did have minimal weight gain. Starting BMI of 20 with 19 lbs total weight gain. She was followed with fetal growth scans, measuring at the 16% at 34w and most recently done at 37.6 showing EFW 5#15 (10%) with AC 15% normal fluid and vertex presentation. She had a normal 1hr GTT of 69 and received Rhogam on 3/31 for RH NEG status. +GBS screening.  Patient had recent antepartum admission 5/16-5/18 for right sided pyelonephritis where she received IV Rocephin and completed 10 day total treatment course with PO Augmentin on 5/25.  In office earlier this week patient reported new onset itching of the palms and soles, worse at night, in absence of a rash. She had bile acids checked which were upper limit normal at 6 and LFT's although technically normal had doubled from her baseline.  Patient has her boyfriend CLandry Mellowand her mom present for labor support. They are expecting a baby girl "Sydney Dunn"   OB History     Gravida  1   Para  0   Term  0   Preterm  0   AB  0   Living  0      SAB  0   IAB  0   Ectopic  0   Multiple  0   Live Births  0          Past Medical History:  Diagnosis Date   Bartholin cyst    Cholestasis    Family history of allergies    Headache    migraines - 2x/mo.  milder HA more often   History of headache     history of heart murmur    History of hypotension    History of pyelonephritis    Migraine without aura    Past Surgical History:  Procedure Laterality Date   FOOT SURGERY Left    HERNIA REPAIR     [place] [facility]    89[place]   Family History: family history includes Breast cancer in an other family member; Diabetes in her father, maternal grandfather, and maternal grandmother; Heart disease in her maternal grandfather; Hyperlipidemia in her maternal grandfather; Hypertension in her father, maternal grandfather, maternal grandmother, and mother; Kidney disease in an other family member. Social History:  reports that she has never smoked. She has never used smokeless tobacco. She reports that she does not drink alcohol and does not use drugs.     Maternal Diabetes: No Genetic Screening: Normal Maternal Ultrasounds/Referrals: Normal Fetal Ultrasounds or other Referrals:  None Maternal Substance Abuse:  No Significant Maternal Medications:  None Significant Maternal Lab Results:  Group B Strep positive and Rh negative Other Comments:  None  Review of Systems  All other systems reviewed and are negative. Per HPI Maternal Exam:  Uterine Assessment: Contraction frequency is rare.  Abdomen: Estimated fetal weight is 5#10.   Fetal presentation: vertex Introitus: Normal vulva. Normal vagina.  Pelvis: adequate for delivery.   Cervix: Cervix evaluated by digital exam.     Fetal Exam Fetal Monitor Review: Baseline rate: 135.  Variability: moderate (6-25 bpm).   Pattern: accelerations present and no decelerations.   Fetal State Assessment: Category I - tracings are normal.  Physical Exam Vitals reviewed.  Constitutional:      Appearance: Normal appearance.  HENT:     Head: Normocephalic.  Cardiovascular:     Rate and Rhythm: Normal rate.  Pulmonary:     Effort: Pulmonary effort is normal.  Abdominal:     Palpations: Abdomen  is soft.     Tenderness: There is no abdominal tenderness.  Genitourinary:    General: Normal vulva.  Musculoskeletal:        General: Normal range of motion.     Cervical back: Normal range of motion.  Skin:    General: Skin is warm and dry.  Neurological:     General: No focal deficit present.     Mental Status: She is alert and oriented to person, place, and time.  Psychiatric:        Mood and Affect: Mood normal.        Behavior: Behavior normal.    Dilation: 2 Effacement (%): 80 Station: -1 Exam by:: Dr. Lanny Cramp Blood pressure 116/65, pulse 76, temperature 98.3 F (36.8 C), temperature source Oral, height '5\' 2"'$  (1.575 m), weight 58 kg, last menstrual period 03/12/2021.  Prenatal labs: ABO, Rh:  --/--/O NEG (05/27 1428) Antibody: POS (05/27 1428) Rubella: Immune (11/14 0000) RPR: Nonreactive (11/14 0000)  HBsAg: Negative (11/14 0000)  HIV: Non-reactive (11/14 0000)  GBS: Positive/-- (05/09 0000)   ChemistryNo results for input(s): NA, K, CL, CO2, GLUCOSE, BUN, CREATININE, CALCIUM, GFRNONAA, GFRAA, ANIONGAP in the last 168 hours.  No results for input(s): PROT, ALBUMIN, AST, ALT, ALKPHOS, BILITOT in the last 168 hours. Hematology Recent Labs  Lab 02/05/22 1428  WBC 13.8*  RBC 3.62*  HGB 10.6*  HCT 32.7*  MCV 90.3  MCH 29.3  MCHC 32.4  RDW 12.7  PLT 393   Cardiac EnzymesNo results for input(s): TROPONINI in the last 168 hours. No results for input(s): TROPIPOC in the last 168 hours.  BNPNo results for input(s): BNP, PROBNP in the last 168 hours.  DDimer No results for input(s): DDIMER in the last 168 hours.   Assessment/Plan: [name] is a 21 y.o. female G1P0000 56w2dadmitted for IOL for IHCP and SGA  -Admit to LD -Routine admission labs -IOL with low dose Pitocin 1x1 until adequately treated for GBS. We discussed possible AROM once treated -PCN for +GBS, NKDA -Cont EFM/Toco -SGA EFW 10% - CAT I tracing -Pt may have epidural on request -RH NEG  will need PP ABORh and Rhogam as indicated -Rubella IMM, Varicella Non-Imm offer PP vaccination -IHCP normal Bas/LFTs earlier this week, consider repeat labs if symptoms not improving after delivery -Routine intrapartum care -Anticipate SVD  Cato Liburd A Janah Mcculloh 02/05/2022, 6:46 PM

## 2022-02-05 NOTE — Anesthesia Preprocedure Evaluation (Signed)
Anesthesia Evaluation  Patient identified by MRN, date of birth, ID band Patient awake    Reviewed: Allergy & Precautions, H&P , NPO status , Patient's Chart, lab work & pertinent test results  History of Anesthesia Complications Negative for: history of anesthetic complications  Airway Mallampati: II  TM Distance: >3 FB     Dental   Pulmonary neg pulmonary ROS,    Pulmonary exam normal        Cardiovascular negative cardio ROS   Rhythm:regular Rate:Normal     Neuro/Psych  Headaches, negative psych ROS   GI/Hepatic negative GI ROS, Neg liver ROS,   Endo/Other  negative endocrine ROS  Renal/GU negative Renal ROS  negative genitourinary   Musculoskeletal   Abdominal   Peds  Hematology negative hematology ROS (+)   Anesthesia Other Findings   Reproductive/Obstetrics (+) Pregnancy                             Anesthesia Physical Anesthesia Plan  ASA: 2  Anesthesia Plan: Epidural   Post-op Pain Management:    Induction:   PONV Risk Score and Plan:   Airway Management Planned:   Additional Equipment:   Intra-op Plan:   Post-operative Plan:   Informed Consent: I have reviewed the patients History and Physical, chart, labs and discussed the procedure including the risks, benefits and alternatives for the proposed anesthesia with the patient or authorized representative who has indicated his/her understanding and acceptance.       Plan Discussed with:   Anesthesia Plan Comments:         Anesthesia Quick Evaluation

## 2022-02-06 ENCOUNTER — Encounter (HOSPITAL_COMMUNITY): Payer: Self-pay | Admitting: Obstetrics and Gynecology

## 2022-02-06 ENCOUNTER — Other Ambulatory Visit: Payer: Self-pay

## 2022-02-06 DIAGNOSIS — Z6791 Unspecified blood type, Rh negative: Secondary | ICD-10-CM | POA: Diagnosis present

## 2022-02-06 DIAGNOSIS — B951 Streptococcus, group B, as the cause of diseases classified elsewhere: Secondary | ICD-10-CM | POA: Diagnosis present

## 2022-02-06 DIAGNOSIS — K831 Obstruction of bile duct: Secondary | ICD-10-CM | POA: Diagnosis present

## 2022-02-06 LAB — RPR: RPR Ser Ql: NONREACTIVE

## 2022-02-06 MED ORDER — SENNOSIDES-DOCUSATE SODIUM 8.6-50 MG PO TABS: 2.0000 | ORAL_TABLET | Freq: Every day | ORAL | Status: DC

## 2022-02-06 MED ORDER — ACETAMINOPHEN 325 MG PO TABS: 650.0000 mg | ORAL_TABLET | ORAL | Status: DC | PRN

## 2022-02-06 MED ORDER — ONDANSETRON HCL 4 MG PO TABS: 4.0000 mg | ORAL_TABLET | ORAL | Status: DC | PRN

## 2022-02-06 MED ORDER — COCONUT OIL OIL: 1.0000 "application " | TOPICAL_OIL | Status: DC | PRN

## 2022-02-06 MED ORDER — PRENATAL MULTIVITAMIN CH
1.0000 | ORAL_TABLET | Freq: Every day | ORAL | Status: DC
  Administered 2022-02-06: 1 via ORAL
  Filled 2022-02-06: qty 1

## 2022-02-06 MED ORDER — BENZOCAINE-MENTHOL 20-0.5 % EX AERO
1.0000 "application " | INHALATION_SPRAY | CUTANEOUS | Status: DC | PRN
  Administered 2022-02-06: 1 via TOPICAL
  Filled 2022-02-06: qty 56

## 2022-02-06 MED ORDER — TETANUS-DIPHTH-ACELL PERTUSSIS 5-2.5-18.5 LF-MCG/0.5 IM SUSY: 0.5000 mL | PREFILLED_SYRINGE | Freq: Once | INTRAMUSCULAR | Status: DC

## 2022-02-06 MED ORDER — SIMETHICONE 80 MG PO CHEW: 80.0000 mg | CHEWABLE_TABLET | ORAL | Status: DC | PRN

## 2022-02-06 MED ORDER — FENTANYL CITRATE (PF) 100 MCG/2ML IJ SOLN
INTRAMUSCULAR | Status: DC | PRN
  Administered 2022-02-05: 25 ug via INTRATHECAL

## 2022-02-06 MED ORDER — IBUPROFEN 600 MG PO TABS
600.0000 mg | ORAL_TABLET | Freq: Four times a day (QID) | ORAL | Status: DC
  Administered 2022-02-06 – 2022-02-07 (×4): 600 mg via ORAL
  Filled 2022-02-06 (×5): qty 1

## 2022-02-06 MED ORDER — DIPHENHYDRAMINE HCL 25 MG PO CAPS: 25.0000 mg | ORAL_CAPSULE | Freq: Four times a day (QID) | ORAL | Status: DC | PRN

## 2022-02-06 MED ORDER — ZOLPIDEM TARTRATE 5 MG PO TABS: 5.0000 mg | ORAL_TABLET | Freq: Every evening | ORAL | Status: DC | PRN

## 2022-02-06 MED ORDER — WITCH HAZEL-GLYCERIN EX PADS: 1.0000 "application " | MEDICATED_PAD | CUTANEOUS | Status: DC | PRN

## 2022-02-06 MED ORDER — BUPIVACAINE HCL (PF) 0.25 % IJ SOLN
INTRAMUSCULAR | Status: DC | PRN
  Administered 2022-02-05: 1 mL via INTRATHECAL

## 2022-02-06 MED ORDER — ONDANSETRON HCL 4 MG/2ML IJ SOLN: 4.0000 mg | INTRAMUSCULAR | Status: DC | PRN

## 2022-02-06 MED ORDER — DIBUCAINE (PERIANAL) 1 % EX OINT: 1.0000 "application " | TOPICAL_OINTMENT | CUTANEOUS | Status: DC | PRN

## 2022-02-06 NOTE — Lactation Note (Signed)
This note was copied from a baby's chart. Lactation Consultation Note  Patient Name: Girl Atha Muradyan [id number] Date: 02/06/2022 Reason for consult: L&D Initial assessment;Early term 37-38.6wks;Primapara;1st time breastfeeding Age:22 hours   Initial L&D Consult:  Visited with family < 1 hour after birth 66 fussy when I arrived.  Assisted to latch, however, she remained intermittently fussy with few sucks.  Reassurance given.  Encouraged mother to continue to allow her to stay on the breast as long as she is sucking.  If not, place her STS and allow her to settle.  Father and grandmother at bedside.  Allowed time for family bonding.   Maternal Data    Feeding Mother's Current Feeding Choice: Breast Milk  LATCH Score Latch: Repeated attempts needed to sustain latch, nipple held in mouth throughout feeding, stimulation needed to elicit sucking reflex.  Audible Swallowing: None  Type of Nipple: Everted at rest and after stimulation  Comfort (Breast/Nipple): Soft / non-tender  Hold (Positioning): Assistance needed to correctly position infant at breast and maintain latch.  LATCH Score: 6   Lactation Tools Discussed/Used    Interventions Interventions: Assisted with latch;Skin to skin  Discharge    Consult Status Consult Status: Follow-up from L&D    Amylah Will R Wilna Pennie 02/06/2022, 5:19 AM

## 2022-02-06 NOTE — Lactation Note (Addendum)
This note was copied from a baby's chart. Lactation Consultation Note  Patient Name: Sydney Dunn [id number] Date: 02/06/2022 Reason for consult: Initial assessment;Primapara;1st time breastfeeding;Early term 37-38.6wks;Breastfeeding assistance Age:22 hours  P1, Early Term, Female Infant  LC entered the room and baby was asleep on dad's chest. Per mom, she fed baby at 1310 for 15-20 min. Mom states that the latch was deep and non painful. [place] asked mom if she had been taught how to hand express. Mom states that she has been taught how to hand express and she is comfortable with expressing her own milk.   LC spoke with the parents about infant behavior on day 1 of life, watching feeding cues, and cluster feeding.   LC also informed mom of our outpatient services and support group.   Mom states that she does not have a pump at home, but she will be home with her baby.   Mom says that the pumps that she has looked at are very expensive. Mom stated that she has private insurance and [facility] encouraged her to call and speak with them about getting a breast pump.   [place] wrote my name on the board and encouraged her to call for latch assistance as needed.   Current Feeding Plan:  Breastfeed baby 8+ times per day according to feeding cues.  Hand express for stimulation or to encourage baby to latch.  Call for lactation support as needed.   Maternal Data Has patient been taught Hand Expression?: Yes Does the patient have breastfeeding experience prior to this delivery?: No  Feeding Mother's Current Feeding Choice: Breast Milk   Interventions Interventions: Breast feeding basics reviewed;Education;LC Services brochure  Discharge Pump: Advised to call insurance company (Mom does not have a pump at home.)  Consult Status Consult Status: Follow-up Date: 02/07/22 Follow-up type: In-patient    Lysbeth Penner 02/06/2022, 2:42 PM

## 2022-02-06 NOTE — Anesthesia Procedure Notes (Signed)
Combined Spinal-Epidural Patient location during procedure: OB  Staffing Anesthesiologist: Lidia Collum, MD Performed: anesthesiologist   Preanesthetic Checklist Completed: patient identified, IV checked, risks and benefits discussed, monitors and equipment checked, pre-op evaluation and timeout performed  Epidural Patient position: sitting Prep: DuraPrep Patient monitoring: heart rate, continuous pulse ox and blood pressure Approach: midline Location: L3-L4 Injection technique: LOR air  Needle:  Needle type: Tuohy (+ 12.7 cm 25g spinal needle)  Needle gauge: 17 G Needle length: 9 cm Catheter type: closed end flexible Catheter size: 19 Gauge  Assessment Events: blood not aspirated, injection not painful, no injection resistance, no paresthesia and negative IV test  Additional Notes The patient was prepped and draped in the usual sterile fashion. A combined spinal-epidural was performed using a 9 cm 17g Tuohy needle and loss of resistance technique. After encountering LOR, a 12.7 cm 25g spinal needle was introduced via the Tuohy and clear CSF was aspirated prior to injection of fentanyl/local anesthetic. The spinal needle was removed and a 19 g flexible epidural catheter placed prior to Tuohy removal. Patient tolerated the procedure well without complications.Reason for block:procedure for pain

## 2022-02-06 NOTE — Anesthesia Postprocedure Evaluation (Signed)
Anesthesia Post Note  Patient: Sydney Dunn  Procedure(s) Performed: AN AD [place]     Patient location during evaluation: Mother Baby Anesthesia Type: Epidural Level of consciousness: awake and alert Pain management: pain level controlled Vital Signs Assessment: post-procedure vital signs reviewed and stable Respiratory status: spontaneous breathing, nonlabored ventilation and respiratory function stable Cardiovascular status: stable Postop Assessment: no headache, no backache, epidural receding, able to ambulate, no apparent nausea or vomiting, patient able to bend at knees and adequate PO intake Anesthetic complications: no   No notable events documented.  Last Vitals:  Vitals:   02/06/22 0657 02/06/22 0820  BP: 122/72 117/66  Pulse: 75 76  Resp: 18 16  Temp: 36.9 C 36.7 C  SpO2:  99%    Last Pain:  Vitals:   02/06/22 0820  TempSrc: Oral  PainSc:    Pain Goal: Patients Stated Pain Goal: 2 (02/05/22 2100)                 Jabier Mutton

## 2022-02-06 NOTE — Lactation Note (Signed)
This note was copied from a baby's chart. Lactation Consultation Note  Patient Name: Sydney Dunn [id number] Date: 02/06/2022   Age:22 hours   LC Note:  Attempted to visit with family, however, visitors present.  Mother requested a consult later today.  She will call when ready.     Maternal Data    Feeding    LATCH Score                    Lactation Tools Discussed/Used    Interventions    Discharge    Consult Status      Jenissa Tyrell R Jaymar Loeber 02/06/2022, 10:42 AM

## 2022-02-07 LAB — CBC
HCT: 25.2 % — ABNORMAL LOW (ref 36.0–46.0)
Hemoglobin: 8.1 g/dL — ABNORMAL LOW (ref 12.0–15.0)
MCH: 29.6 pg (ref 26.0–34.0)
MCHC: 32.1 g/dL (ref 30.0–36.0)
MCV: 92 fL (ref 80.0–100.0)
Platelets: 306 10*3/uL (ref 150–400)
RBC: 2.74 MIL/uL — ABNORMAL LOW (ref 3.87–5.11)
RDW: 12.8 % (ref 11.5–15.5)
WBC: 18.2 10*3/uL — ABNORMAL HIGH (ref 4.0–10.5)
nRBC: 0 % (ref 0.0–0.2)

## 2022-02-07 MED ORDER — BENZOCAINE-MENTHOL 20-0.5 % EX AERO
1.0000 | INHALATION_SPRAY | CUTANEOUS | 1 refills | Status: DC | PRN
Start: 2022-02-07 — End: 2022-07-25

## 2022-02-07 MED ORDER — IBUPROFEN 600 MG PO TABS: 600.0000 mg | ORAL_TABLET | Freq: Four times a day (QID) | ORAL | 0 refills | Status: DC

## 2022-02-07 NOTE — Discharge Summary (Signed)
OB Discharge Summary  Patient Name: Sydney Dunn DOB: 2000/11/25 MRN: [id number]  Date of admission: 02/05/2022 Delivering provider: Langley Gauss A   Admitting diagnosis: Encounter for induction of labor [Z34.90] Intrauterine pregnancy: [redacted]w[redacted]d    Secondary diagnosis: Patient Active Problem List   Diagnosis Date Noted   SVD 5/28 02/06/2022   Postpartum care following vaginal delivery 5/28 02/06/2022   RhD negative 02/06/2022   Positive GBS test 02/06/2022   Cholestasis during pregnancy 02/06/2022   Second degree perineal laceration 02/06/2022   Encounter for induction of labor 02/05/2022   Pyelonephritis affecting pregnancy in third trimester 01/26/2022    Date of discharge: 02/07/2022    Discharge diagnosis: Principal Problem:   Postpartum care following vaginal delivery 5/28 Active Problems:   Pyelonephritis affecting pregnancy in third trimester   Encounter for induction of labor   SVD 5/28   RhD negative   Positive GBS test   Cholestasis during pregnancy   Second degree perineal laceration                                                            Augmentation: AROM and Pitocin Pain control: Epidural  Laceration:2nd degree;Perineal  Complications: None  Hospital course:  Induction of Labor With Vaginal Delivery   21y.o. yo G1P1001 at 311w3das admitted to the hospital 02/05/2022 for induction of labor.  Indication for induction:  ICP .  Patient had an uncomplicated labor course as follows: Membrane Rupture Time/Date: 7:48 PM ,02/05/2022   Delivery Method:Vaginal, Spontaneous  Episiotomy: None  Lacerations:  2nd degree;Perineal  Details of delivery can be found in separate delivery note.  Patient had a routine postpartum course. Patient is discharged home 02/07/22.  Newborn Data: Birth date:02/06/2022  Birth time:4:28 AM  Gender:Female  Living status:Living  Apgars:9 ,9  Weight:2700 g   Physical exam  Vitals:   02/06/22 1136 02/06/22 1550 02/06/22 2018  02/07/22 0529  BP: 115/78 120/82 104/74 99/60  Pulse: 79 76 68 (!) 59  Resp: '16  18 18  '$ Temp: 98.3 F (36.8 C) 98 F (36.7 C) 97.9 F (36.6 C) 97.6 F (36.4 C)  TempSrc: Oral Oral Oral Oral  SpO2: 96% 98% 97% 98%  Weight:      Height:       General: alert, cooperative, and no distress Lochia: appropriate Uterine Fundus: firm Incision: N/A Perineum: repair intact, no edema DVT Evaluation: No evidence of DVT seen on physical exam.  Labs: Lab Results  Component Value Date   WBC 18.2 (H) 02/07/2022   HGB 8.1 (L) 02/07/2022   HCT 25.2 (L) 02/07/2022   MCV 92.0 02/07/2022   PLT 306 02/07/2022   Discharge instructions:  per After Visit Summary  After Visit Meds:  Allergies as of 02/07/2022   No Known Allergies      Medication List     STOP taking these medications    acetaminophen 325 MG tablet Commonly known as: TYLENOL   amoxicillin 875 MG tablet Commonly known as: AMOXIL   ondansetron 8 MG tablet Commonly known as: ZOFRAN   SUMAtriptan 25 MG tablet Commonly known as: IMITREX       TAKE these medications    benzocaine-Menthol 20-0.5 % Aero Commonly known as: DERMOPLAST Apply 1 application. topically as needed for irritation (perineal  discomfort).   fluticasone 50 MCG/ACT nasal spray Commonly known as: FLONASE Place into the nose.   ibuprofen 600 MG tablet Commonly known as: ADVIL Take 1 tablet (600 mg total) by mouth every 6 (six) hours.   prenatal vitamin w/FE, FA 27-1 MG Tabs tablet Take 1 tablet by mouth daily at 12 noon.       Activity: Advance as tolerated. Pelvic rest for 6 weeks.   Newborn Data: Live born female  Birth Weight: 5 lb 15.2 oz (2700 g) APGAR: 67, 9  Newborn Delivery   Birth date/time: 02/06/2022 04:28:00 Delivery type: Vaginal, Spontaneous     Named [facility]ley Baby Feeding: Breast Disposition:home with mother  Delivery Report:  Review the Delivery Report for details.    Follow up:  Follow-up Information      Law, Cassandra A, DO. Schedule an appointment as soon as possible for a visit in 2 week(s).   Specialty: Obstetrics and Gynecology Contact information: [place] [address] [place], CNM, MSN 02/07/2022, 9:29 AM

## 2022-02-08 LAB — RH IG WORKUP (INCLUDES ABO/RH)
Fetal Screen: NEGATIVE
Gestational Age(Wks): 38.3
Unit division: 0

## 2022-02-08 NOTE — Progress Notes (Deleted)
Spoke with Sydney Dunn at [phone] regarding her missed Rhophyac administration prior to her discharge on 02/07/22. [facility] had already called her and scheduled her to come in today at 1400.

## 2022-02-15 ENCOUNTER — Telehealth (HOSPITAL_COMMUNITY): Payer: Self-pay | Admitting: *Deleted

## 2022-02-15 NOTE — Telephone Encounter (Signed)
Left phone voicemail message.  Odis Hollingshead, RN 02-15-2022 at 3:33pm

## 2022-06-30 ENCOUNTER — Other Ambulatory Visit: Payer: Self-pay | Admitting: Obstetrics and Gynecology

## 2022-07-25 ENCOUNTER — Encounter (HOSPITAL_BASED_OUTPATIENT_CLINIC_OR_DEPARTMENT_OTHER): Payer: Self-pay | Admitting: Obstetrics and Gynecology

## 2022-07-25 NOTE — Progress Notes (Signed)
Spoke w/ via phone for pre-op interview--- "Sydney Dunn, patient Lab needs dos---- CBC, T&S, HCG serum per MD               Lab results------ COVID test -----patient states asymptomatic no test needed Arrive at ------- 1100 on 07/28/22 NPO after MN NO Solid Food.  Clear liquids from MN until--- 1000 on 07/28/22 Med rec completed Medications to take morning of surgery ----- fluticasone nasal spray Diabetic medication ----- n/a Patient instructed no nail polish to be worn day of surgery Patient instructed to bring photo id and insurance card day of surgery Patient aware to have Driver (ride ) / caregiver    for 24 hours after surgery - Jacalyn Lefevre (fiancee) [phone] Patient Special Instructions ----- none  Pre-Op special Istructions ----- none Patient verbalized understanding of instructions that were given at this phone interview. Patient denies shortness of breath, chest pain, fever, cough at this phone interview.  Patient is currently breastfeeding, instructed to bring breastpump and supplies DOS.  Lyndel Pleasure, RN

## 2022-07-28 ENCOUNTER — Encounter (HOSPITAL_BASED_OUTPATIENT_CLINIC_OR_DEPARTMENT_OTHER)
Admission: RE | Disposition: A | Discharge status: Home / Self Care | Payer: Self-pay | Source: Home / Self Care | Attending: Obstetrics and Gynecology

## 2022-07-28 ENCOUNTER — Ambulatory Visit (HOSPITAL_BASED_OUTPATIENT_CLINIC_OR_DEPARTMENT_OTHER): Payer: BC Managed Care – PPO | Admitting: Certified Registered Nurse Anesthetist

## 2022-07-28 ENCOUNTER — Other Ambulatory Visit: Payer: Self-pay

## 2022-07-28 ENCOUNTER — Ambulatory Visit (HOSPITAL_BASED_OUTPATIENT_CLINIC_OR_DEPARTMENT_OTHER)
Admission: RE | Admit: 2022-07-28 | Discharge: 2022-07-28 | Disposition: A | Discharge status: Home / Self Care | Payer: BC Managed Care – PPO | Attending: Obstetrics and Gynecology | Admitting: Obstetrics and Gynecology

## 2022-07-28 ENCOUNTER — Encounter (HOSPITAL_BASED_OUTPATIENT_CLINIC_OR_DEPARTMENT_OTHER): Payer: Self-pay | Admitting: Obstetrics and Gynecology

## 2022-07-28 DIAGNOSIS — N750 Cyst of Bartholin's gland: Secondary | ICD-10-CM | POA: Insufficient documentation

## 2022-07-28 DIAGNOSIS — Z01818 Encounter for other preprocedural examination: Secondary | ICD-10-CM

## 2022-07-28 HISTORY — PX: BARTHOLIN CYST MARSUPIALIZATION: SHX5383

## 2022-07-28 HISTORY — DX: Tremor, unspecified: R25.1

## 2022-07-28 LAB — CBC
HCT: 37.4 % (ref 36.0–46.0)
Hemoglobin: 12.5 g/dL (ref 12.0–15.0)
MCH: 31 pg (ref 26.0–34.0)
MCHC: 33.4 g/dL (ref 30.0–36.0)
MCV: 92.8 fL (ref 80.0–100.0)
Platelets: 206 10*3/uL (ref 150–400)
RBC: 4.03 MIL/uL (ref 3.87–5.11)
RDW: 12 % (ref 11.5–15.5)
WBC: 6.5 10*3/uL (ref 4.0–10.5)
nRBC: 0 % (ref 0.0–0.2)

## 2022-07-28 LAB — POCT PREGNANCY, URINE: Preg Test, Ur: NEGATIVE

## 2022-07-28 LAB — TYPE AND SCREEN
ABO/RH(D): O NEG
Antibody Screen: NEGATIVE

## 2022-07-28 LAB — HCG, SERUM, QUALITATIVE: Preg, Serum: NEGATIVE

## 2022-07-28 SURGERY — MARSUPIALIZATION, CYST, BARTHOLIN'S GLAND
Anesthesia: General | Site: Vagina

## 2022-07-28 MED ORDER — CEFAZOLIN SODIUM-DEXTROSE 2-3 GM-%(50ML) IV SOLR
INTRAVENOUS | Status: DC | PRN
  Administered 2022-07-28: 2 g via INTRAVENOUS

## 2022-07-28 MED ORDER — DEXAMETHASONE SODIUM PHOSPHATE 10 MG/ML IJ SOLN
INTRAMUSCULAR | Status: AC
  Filled 2022-07-28: qty 1

## 2022-07-28 MED ORDER — MIDAZOLAM HCL 2 MG/2ML IJ SOLN
INTRAMUSCULAR | Status: AC
  Filled 2022-07-28: qty 2

## 2022-07-28 MED ORDER — KETOROLAC TROMETHAMINE 30 MG/ML IJ SOLN
INTRAMUSCULAR | Status: DC | PRN
  Administered 2022-07-28: 30 mg via INTRAVENOUS

## 2022-07-28 MED ORDER — BUPIVACAINE HCL (PF) 0.25 % IJ SOLN
INTRAMUSCULAR | Status: DC | PRN
  Administered 2022-07-28: 1 mL

## 2022-07-28 MED ORDER — LACTATED RINGERS IV SOLN: INTRAVENOUS | Status: DC

## 2022-07-28 MED ORDER — CEFAZOLIN SODIUM 1 G IJ SOLR
INTRAMUSCULAR | Status: AC
  Filled 2022-07-28: qty 20

## 2022-07-28 MED ORDER — POVIDONE-IODINE 10 % EX SWAB: 2.0000 | Freq: Once | CUTANEOUS | Status: DC

## 2022-07-28 MED ORDER — EPHEDRINE 5 MG/ML INJ
INTRAVENOUS | Status: AC
  Filled 2022-07-28: qty 5

## 2022-07-28 MED ORDER — LIDOCAINE 2% (20 MG/ML) 5 ML SYRINGE
INTRAMUSCULAR | Status: DC | PRN
  Administered 2022-07-28: 60 mg via INTRAVENOUS

## 2022-07-28 MED ORDER — 0.9 % SODIUM CHLORIDE (POUR BTL) OPTIME
TOPICAL | Status: DC | PRN
  Administered 2022-07-28: 500 mL

## 2022-07-28 MED ORDER — DEXAMETHASONE SODIUM PHOSPHATE 10 MG/ML IJ SOLN
INTRAMUSCULAR | Status: DC | PRN
  Administered 2022-07-28: 5 mg via INTRAVENOUS

## 2022-07-28 MED ORDER — HYDROMORPHONE HCL 1 MG/ML IJ SOLN
INTRAMUSCULAR | Status: AC
  Filled 2022-07-28: qty 1

## 2022-07-28 MED ORDER — PROPOFOL 10 MG/ML IV BOLUS
INTRAVENOUS | Status: DC | PRN
  Administered 2022-07-28: 40 mg via INTRAVENOUS
  Administered 2022-07-28: 160 mg via INTRAVENOUS

## 2022-07-28 MED ORDER — FENTANYL CITRATE (PF) 100 MCG/2ML IJ SOLN
INTRAMUSCULAR | Status: DC | PRN
  Administered 2022-07-28 (×3): 50 ug via INTRAVENOUS

## 2022-07-28 MED ORDER — SODIUM CHLORIDE (PF) 0.9 % IJ SOLN
INTRAMUSCULAR | Status: AC
  Filled 2022-07-28: qty 10

## 2022-07-28 MED ORDER — PROPOFOL 10 MG/ML IV BOLUS
INTRAVENOUS | Status: AC
  Filled 2022-07-28: qty 20

## 2022-07-28 MED ORDER — ONDANSETRON HCL 4 MG/2ML IJ SOLN
INTRAMUSCULAR | Status: AC
  Filled 2022-07-28: qty 2

## 2022-07-28 MED ORDER — AMOXICILLIN-POT CLAVULANATE 875-125 MG PO TABS: 1.0000 | ORAL_TABLET | Freq: Two times a day (BID) | ORAL | 0 refills | Status: AC

## 2022-07-28 MED ORDER — FENTANYL CITRATE (PF) 100 MCG/2ML IJ SOLN
INTRAMUSCULAR | Status: AC
  Filled 2022-07-28: qty 2

## 2022-07-28 MED ORDER — EPHEDRINE SULFATE-NACL 50-0.9 MG/10ML-% IV SOSY
PREFILLED_SYRINGE | INTRAVENOUS | Status: DC | PRN
  Administered 2022-07-28 (×5): 5 mg via INTRAVENOUS

## 2022-07-28 MED ORDER — ONDANSETRON HCL 4 MG/2ML IJ SOLN
INTRAMUSCULAR | Status: DC | PRN
  Administered 2022-07-28: 4 mg via INTRAVENOUS

## 2022-07-28 MED ORDER — HYDROMORPHONE HCL 1 MG/ML IJ SOLN
0.2500 mg | INTRAMUSCULAR | Status: DC | PRN
  Administered 2022-07-28 (×2): 0.25 mg via INTRAVENOUS

## 2022-07-28 MED ORDER — MIDAZOLAM HCL 5 MG/5ML IJ SOLN
INTRAMUSCULAR | Status: DC | PRN
  Administered 2022-07-28: 2 mg via INTRAVENOUS

## 2022-07-28 SURGICAL SUPPLY — 21 items
BLADE SURG 15 STRL LF DISP TIS (BLADE) ×1 IMPLANT
BLADE SURG 15 STRL SS (BLADE) ×1
ELECT REM PT RETURN 9FT ADLT (ELECTROSURGICAL) ×1
ELECTRODE REM PT RTRN 9FT ADLT (ELECTROSURGICAL) IMPLANT
GAUZE PACKING 1/2INX5YD STRL (GAUZE/BANDAGES/DRESSINGS) IMPLANT
GLOVE BIO SURGEON STRL SZ 6.5 (GLOVE) IMPLANT
GLOVE BIOGEL PI IND STRL 7.0 (GLOVE) ×2 IMPLANT
GLOVE ECLIPSE 6.5 STRL STRAW (GLOVE) ×2 IMPLANT
GLOVE SURG SS PI 6.5 STRL IVOR (GLOVE) IMPLANT
GOWN STRL REUS W/ TWL LRG LVL3 (GOWN DISPOSABLE) IMPLANT
GOWN STRL REUS W/TWL LRG LVL3 (GOWN DISPOSABLE) ×3 IMPLANT
KIT TURNOVER CYSTO (KITS) ×1 IMPLANT
NEEDLE HYPO 22GX1.5 SAFETY (NEEDLE) IMPLANT
NS IRRIG 500ML POUR BTL (IV SOLUTION) IMPLANT
PACK VAGINAL WOMENS (CUSTOM PROCEDURE TRAY) ×1 IMPLANT
PAD OB MATERNITY 4.3X12.25 (PERSONAL CARE ITEMS) ×1 IMPLANT
SUT VICRYL RAPIDE 4/0 PS 2 (SUTURE) ×2 IMPLANT
SWAB COLLECTION DEVICE MRSA (MISCELLANEOUS) IMPLANT
SWAB CULTURE ESWAB REG 1ML (MISCELLANEOUS) IMPLANT
SYR BULB EAR ULCER 3OZ GRN STR (SYRINGE) IMPLANT
TOWEL OR 17X24 6PK STRL BLUE (TOWEL DISPOSABLE) IMPLANT

## 2022-07-28 NOTE — Op Note (Signed)
Sydney Dunn Jun 02, 2001 [id number]  OPERATIVE NOTE  PROCEDURE: right bartholin cyst marsupialization  PREOPERATIVE DIAGNOSIS: recurrent right bartholin cyst  POSTOPERATIVE DIAGNOSIS: recurrent right bartholin cyst  FINDINGS: normal external female genitalia, right enlarged 4-5 cm actively draining Bartholin cyst for milky white fluid, normal appearing left Bartholin, normal vaginal mucosa  SURGEON: Langley Gauss, DO  SURGICAL ASSISTANT: Aloha Gell, MD  MEDICATIONS: Ancef 2 g   EBL: 10 cc  FLUIDS: per anesthesia  COMPLICATIONS: none  PROCEDURE IN DETAIL:  After the patient was appropriately consented in the preoperative holding unit, she was taken to the operating room where anesthesia was obtained without complications. Patient was placed in the dorsal lithotomy positioning. She had sequential compression devices placed and cycled on. She was prepped and draped in the usual sterile fashion. An appropriate time out was performed that confirmed correct patient, procedure, and surgical team.  Attention was made to the pelvis and the right bartholin enlarged cyst findings were as noted above. The vaginal mucosa was grasped with two Hemostat clamps and tented upwards and away from the underlying cyst. A 0.25% Marcaine solution was injected at the level of the mucosa for separation of the tissue planes. A number 15 blade was used to remove approximately 1-1.5 cm segment of vaginal mucosa in an elliptical fashion. The underlying cyst wall was then identified and grasped with Hemostat clamps and dissected away from the vaginal mucosa. The cyst wall was then entered and a small segment of cyst wall removed. This allowed for adequate drainage of the bartholin cyst. Copious irrigation of the cyst was performed with sterile saline. Digital inspection of the cyst confirmed smooth walls without loculations. Hemostasis obtained along the vaginal mucosa with the Bovie. The bartholin cyst wall was  then sutured to the vaginal mucosa in a normal running fashion, leaving an open tract to the vagina approximately 1 cm in size. Adequate hemostasis was noted along the suture line. A final sterile saline irrigation was performed. The patient tolerated the procedure well and was taken to the recovery room in stable condition. All instrument, needle, and lap count were correct.  Talajah Slimp A Nile Prisk 07/28/22 2:04 PM

## 2022-07-28 NOTE — H&P (Signed)
Sydney Dunn is an 21 y.o. female presenting for scheduled surgery.  Patient is Sydney 49Y G1P1 with history of recurrent Bartholin cyst abscess  The Bartholin cyst/abscess has been an ongoing recurrent issue for years Always involving the right side. Has had multiple I/D and Word catheter placement in the past Was going to have marsupialization done at previous GYN but then became pregnant Had Bartholin drained twice during the pregnancy, and then most recently in September this year Patient is now 6 months PP status post uncomplicated SVD. Currently nursing  Patient is otherwise healthy with no major medical problems   Menstrual History: No LMP recorded (lmp unknown). (Menstrual status: Lactating).    Past Medical History:  Diagnosis Date   Bartholin cyst    I&D x 4, last time [facility] 06/04/22   Cholestasis    History of headache    History of hypotension    History of pyelonephritis    Migraine without aura    Tremor    mild physiologic tremor per neuro, no need for f/u    Past Surgical History:  Procedure Laterality Date   FOOT SURGERY Left    HERNIA REPAIR     abdominal hernia 2010 at [place]     58 months of age    Family History  Problem Relation Age of Onset   Breast cancer Other        paternal great grandmother   Hyperlipidemia Maternal Grandfather    Heart disease Maternal Grandfather    Hypertension Maternal Grandfather    Diabetes Maternal Grandfather    Hypertension Mother    Hypertension Father    Diabetes Father    Hypertension Maternal Grandmother    Diabetes Maternal Grandmother    Kidney disease Other        great grandfather    Social History:  reports that she has never smoked. She has never used smokeless tobacco. She reports current alcohol use. She reports that she does not use drugs.  Allergies: No Known Allergies  Medications Prior to Admission  Medication Sig Dispense Refill Last Dose   ibuprofen (ADVIL)  600 MG tablet Take 1 tablet (600 mg total) by mouth every 6 (six) hours. 30 tablet 0 Past Week   medroxyPROGESTERone (DEPO-PROVERA) 150 MG/ML injection Inject 150 mg into the muscle every 3 (three) months.   Past Month   prenatal vitamin w/FE, FA (PRENATAL 1 + 1) 27-1 MG TABS tablet Take 1 tablet by mouth daily at 12 noon.   07/27/2022   fluticasone (FLONASE) 50 MCG/ACT nasal spray Place into the nose.   More than Sydney month    Review of Systems  All other systems reviewed and are negative.   Blood pressure 124/74, pulse 64, temperature 97.9 F (36.6 C), temperature source Oral, resp. rate 15, height '5\' 2"'$  (1.575 m), weight 47.8 kg, SpO2 98 %, currently breastfeeding. Physical Exam Vitals reviewed.  Constitutional:      Appearance: Normal appearance. She is normal weight.  HENT:     Head: Normocephalic.  Cardiovascular:     Rate and Rhythm: Normal rate.  Pulmonary:     Effort: Pulmonary effort is normal.  Abdominal:     General: Abdomen is flat.     Palpations: Abdomen is soft.  Musculoskeletal:        General: Normal range of motion.     Cervical back: Normal range of motion.  Skin:    General: Skin is warm and dry.  Neurological:     General: No focal deficit present.     Mental Status: She is alert and oriented to person, place, and time.  Psychiatric:        Mood and Affect: Mood normal.        Behavior: Behavior normal.     Results for orders placed or performed during the hospital encounter of 07/28/22 (from the past 24 hour(s))  Pregnancy, urine POC     Status: None   Collection Time: 07/28/22 11:08 AM  Result Value Ref Range   Preg Test, Ur NEGATIVE NEGATIVE  CBC     Status: None   Collection Time: 07/28/22 11:45 AM  Result Value Ref Range   WBC 6.5 4.0 - 10.5 K/uL   RBC 4.03 3.87 - 5.11 MIL/uL   Hemoglobin 12.5 12.0 - 15.0 g/dL   HCT 37.4 36.0 - 46.0 %   MCV 92.8 80.0 - 100.0 fL   MCH 31.0 26.0 - 34.0 pg   MCHC 33.4 30.0 - 36.0 g/dL   RDW 12.0 11.5 - 15.5 %    Platelets 206 150 - 400 K/uL   nRBC 0.0 0.0 - 0.2 %  Type and screen     Status: None (Preliminary result)   Collection Time: 07/28/22 11:45 AM  Result Value Ref Range   ABO/RH(D) PENDING    Antibody Screen PENDING    Sample Expiration      07/31/2022,2359 Performed at [facility], [place] [address], [place], [place] [address]   hCG, serum, qualitative     Status: None   Collection Time: 07/28/22 11:45 AM  Result Value Ref Range   Preg, Serum NEGATIVE NEGATIVE    No results found.  Assessment/Plan:  87Y G1P1 with recurrent right Bartholin cyst consented for marsupialization procedure  Patient has been consented for the above procedure both in the office and again in the preoperative holding unit today. She has been counseled on risks of surgery including bleeding, infection, and damage to surrounding organs. Also counseled on risks of procedure failure with recurrence of Bartholin cyst. Also counseled on inherit risks of anesthesia. Patient verbalizes understanding of risks, questions answered, and consents signed.  -Admit to OR -No antibiotics indicated -SCD VTE ppx -SPT NEG  -Routine intra-op/postop care -Anticipate DC home today. DC instructions and plan for postop visit reviewed  Sydney Dunn Sydney Dunn 07/28/2022, 12:51 PM

## 2022-07-28 NOTE — Discharge Instructions (Signed)

## 2022-07-28 NOTE — Anesthesia Preprocedure Evaluation (Signed)
Anesthesia Evaluation  Patient identified by MRN, date of birth, ID band Patient awake    Reviewed: Allergy & Precautions, NPO status , Patient's Chart, lab work & pertinent test results  Airway Mallampati: II       Dental   Pulmonary neg pulmonary ROS   breath sounds clear to auscultation       Cardiovascular negative cardio ROS  Rhythm:Regular Rate:Normal     Neuro/Psych  Headaches    GI/Hepatic negative GI ROS, Neg liver ROS,,,  Endo/Other  negative endocrine ROS    Renal/GU negative Renal ROS     Musculoskeletal   Abdominal   Peds  Hematology   Anesthesia Other Findings   Reproductive/Obstetrics                             Anesthesia Physical Anesthesia Plan  ASA: 1  Anesthesia Plan: General   Post-op Pain Management: Tylenol PO (pre-op)*   Induction:   PONV Risk Score and Plan: 3 and Ondansetron, Dexamethasone and Midazolam  Airway Management Planned: LMA  Additional Equipment:   Intra-op Plan:   Post-operative Plan: Extubation in OR  Informed Consent: I have reviewed the patients History and Physical, chart, labs and discussed the procedure including the risks, benefits and alternatives for the proposed anesthesia with the patient or authorized representative who has indicated his/her understanding and acceptance.     Dental advisory given  Plan Discussed with: CRNA and Anesthesiologist  Anesthesia Plan Comments:        Anesthesia Quick Evaluation

## 2022-07-28 NOTE — Transfer of Care (Signed)
Immediate Anesthesia Transfer of Care Note  Patient: Sydney Dunn  Procedure(s) Performed: BARTHOLIN CYST MARSUPIALIZATION (Vagina )  Patient Location: PACU  Anesthesia Type:General  Level of Consciousness: awake, alert , oriented, and patient cooperative  Airway & Oxygen Therapy: Patient Spontanous Breathing  Post-op Assessment: Report given to RN and Post -op Vital signs reviewed and stable  Post vital signs: Reviewed and stable  Last Vitals:  Vitals Value Taken Time  BP 123/55 07/28/22 1400  Temp    Pulse 106 07/28/22 1401  Resp 14 07/28/22 1403  SpO2 100 % 07/28/22 1401  Vitals shown include unvalidated device data.  Last Pain:  Vitals:   07/28/22 1130  TempSrc: Oral         Complications: No notable events documented.

## 2022-07-28 NOTE — Anesthesia Procedure Notes (Signed)
Procedure Name: LMA Insertion Date/Time: 07/28/2022 1:01 PM  Performed by: Rogers Blocker, CRNAPre-anesthesia Checklist: Patient identified, Emergency Drugs available, Suction available and Patient being monitored Patient Re-evaluated:Patient Re-evaluated prior to induction Oxygen Delivery Method: Circle System Utilized Preoxygenation: Pre-oxygenation with 100% oxygen Induction Type: IV induction Ventilation: Mask ventilation without difficulty LMA: LMA inserted LMA Size: 3.0 Number of attempts: 1 Placement Confirmation: positive ETCO2 Tube secured with: Tape Dental Injury: Teeth and Oropharynx as per pre-operative assessment

## 2022-07-28 NOTE — Anesthesia Postprocedure Evaluation (Signed)
Anesthesia Post Note  Patient: Sydney Dunn  Procedure(s) Performed: BARTHOLIN CYST MARSUPIALIZATION (Vagina )     Patient location during evaluation: PACU Anesthesia Type: General Level of consciousness: awake Pain management: pain level controlled Vital Signs Assessment: post-procedure vital signs reviewed and stable Respiratory status: spontaneous breathing Cardiovascular status: stable Postop Assessment: no apparent nausea or vomiting Anesthetic complications: no   No notable events documented.  Last Vitals:  Vitals:   07/28/22 1500 07/28/22 1547  BP: 115/71 109/71  Pulse: 89 74  Resp: 12 15  Temp:  36.4 C  SpO2: 100% 97%    Last Pain:  Vitals:   07/28/22 1547  TempSrc:   PainSc: 1                  Lochlyn Zullo

## 2022-07-29 ENCOUNTER — Encounter (HOSPITAL_BASED_OUTPATIENT_CLINIC_OR_DEPARTMENT_OTHER): Payer: Self-pay | Admitting: Obstetrics and Gynecology

## 2023-07-02 IMAGING — US US RENAL
1 series · 15 of 25 positions shown · non-contrast
Comparison: None Available.

CLINICAL DATA: Renal infection, right flank pain, back pain, 36
weeks pregnant, fever

EXAM:
RENAL / URINARY TRACT ULTRASOUND COMPLETE

[Series 1: us renal · 15 of 38 slices shown]
[im 1/38]
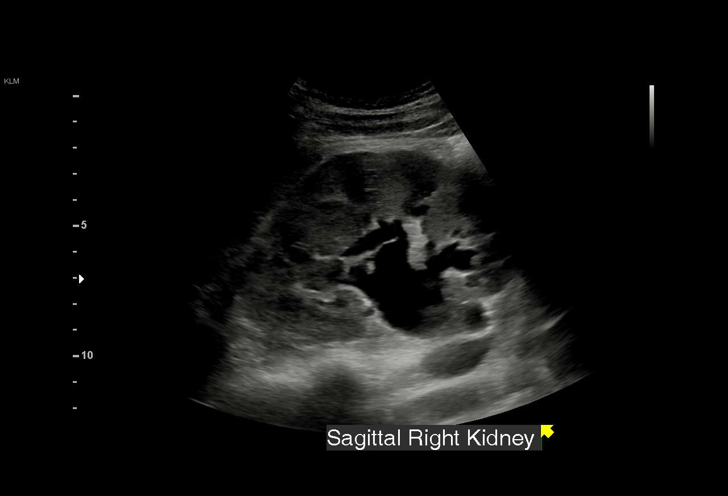
[im 4/38]
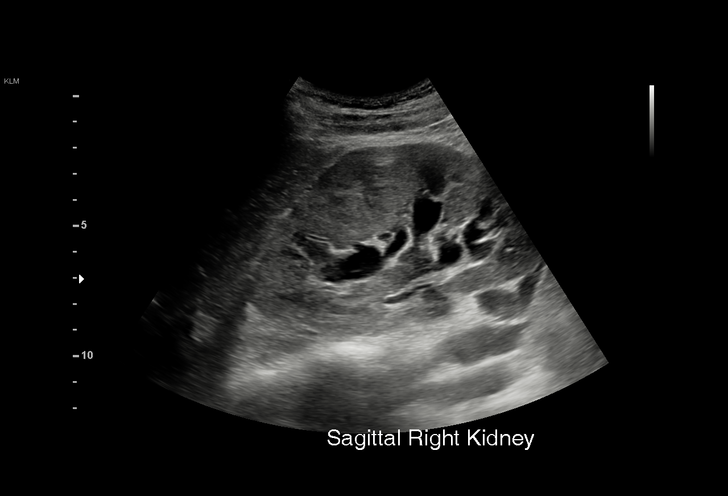
[im 7/38]
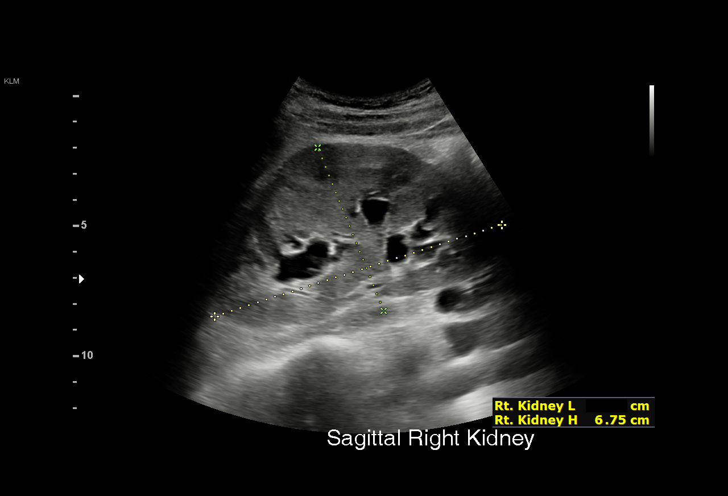
[im 8/38]
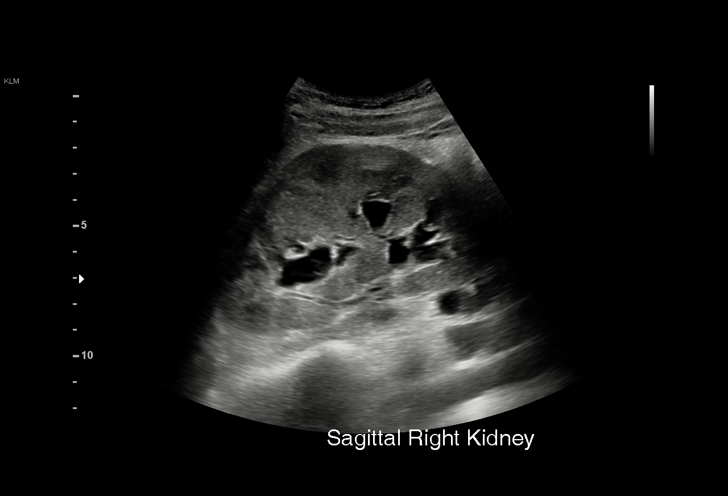
[im 11/38]
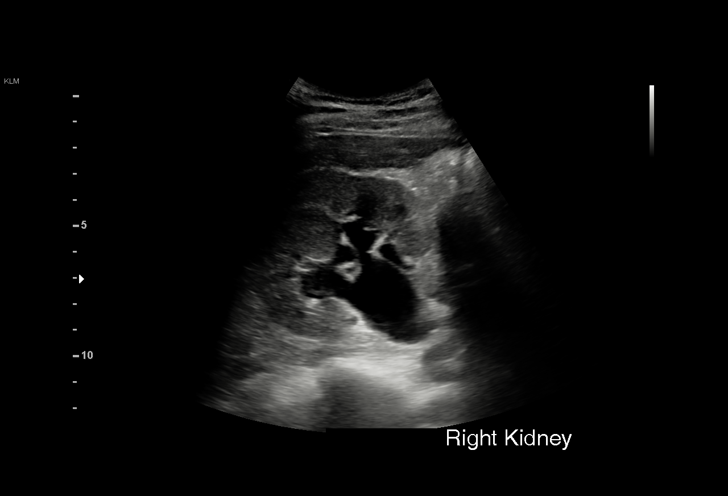
[im 14/38]
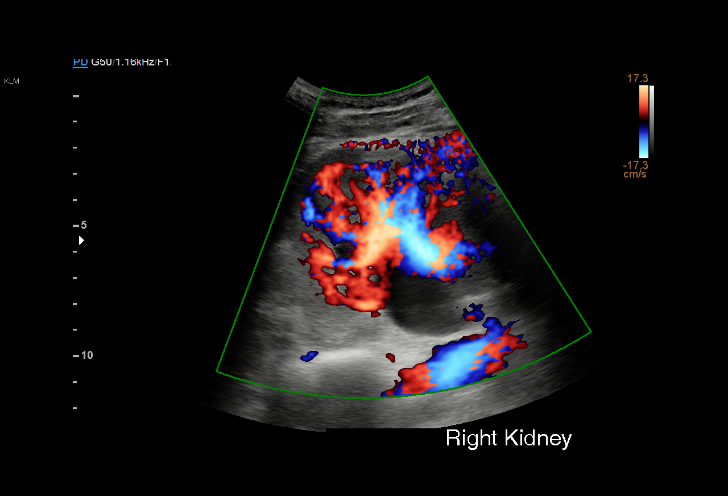
[im 16/38]
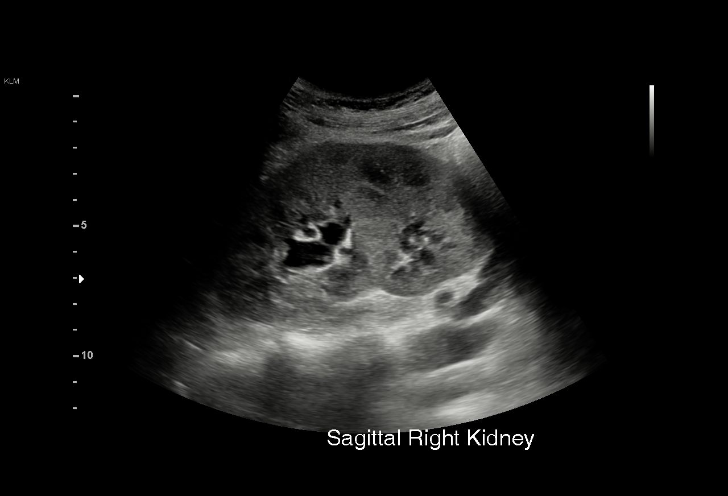
[im 19/38]
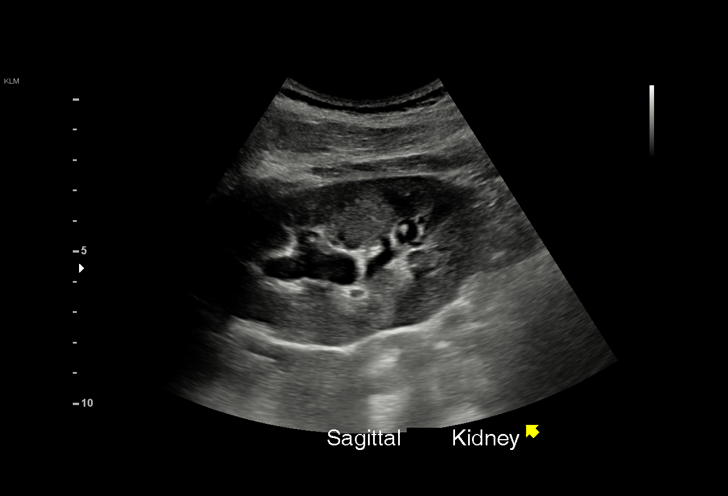
[im 22/38]
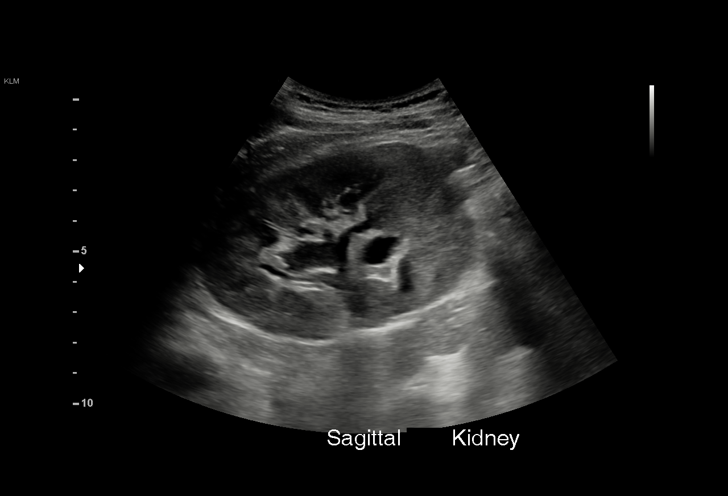
[im 24/38]
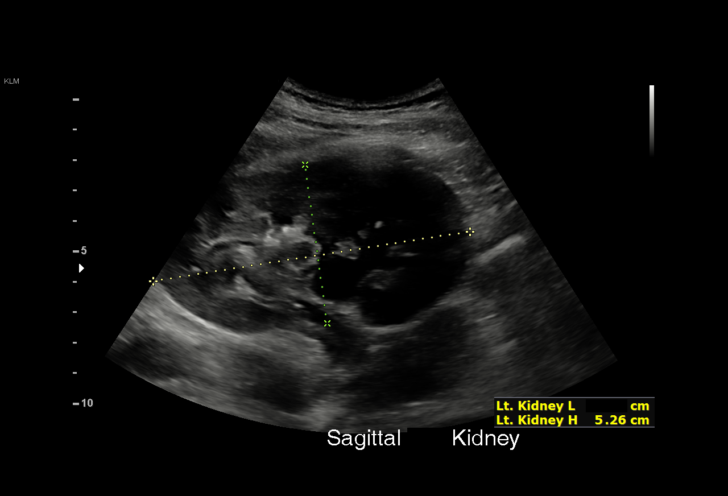
[im 27/38]
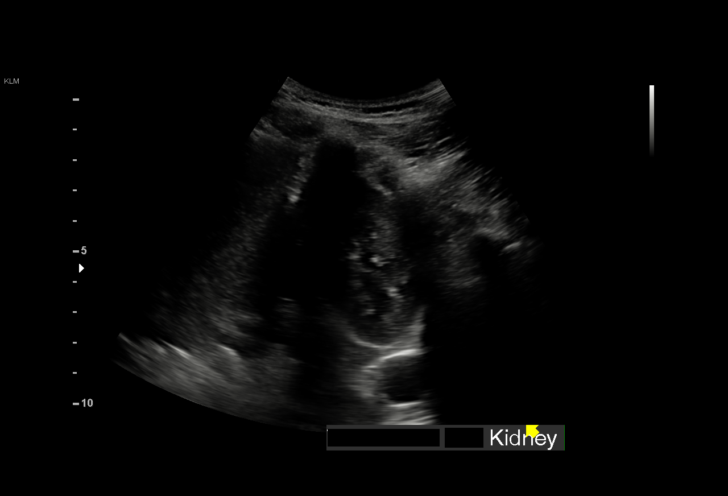
[im 30/38]
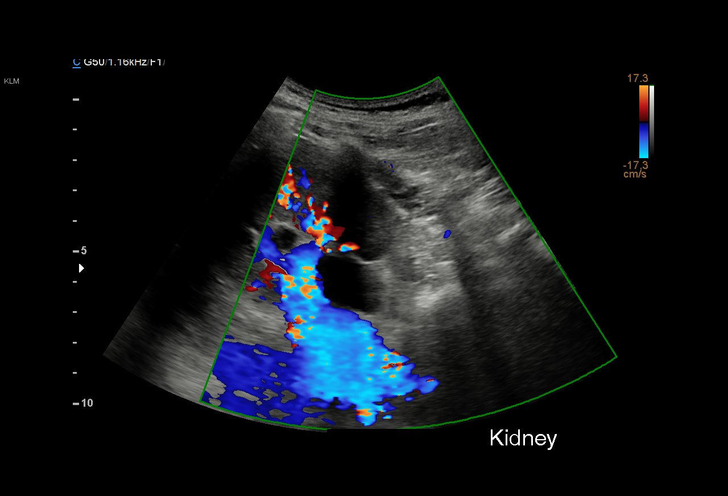
[im 31/38]
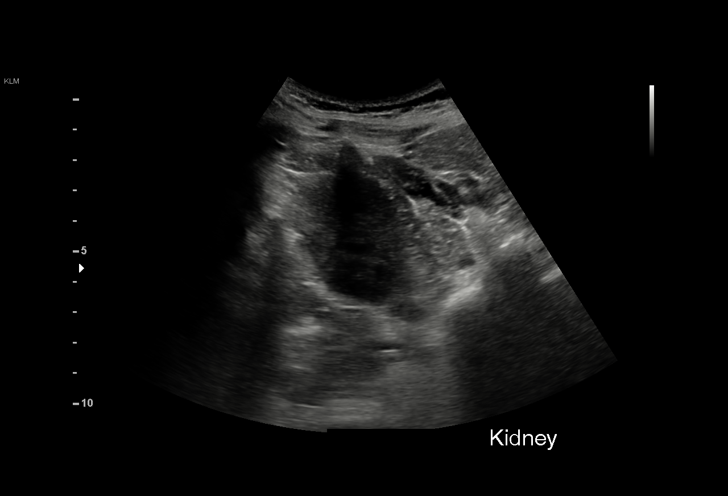
[im 34/38]
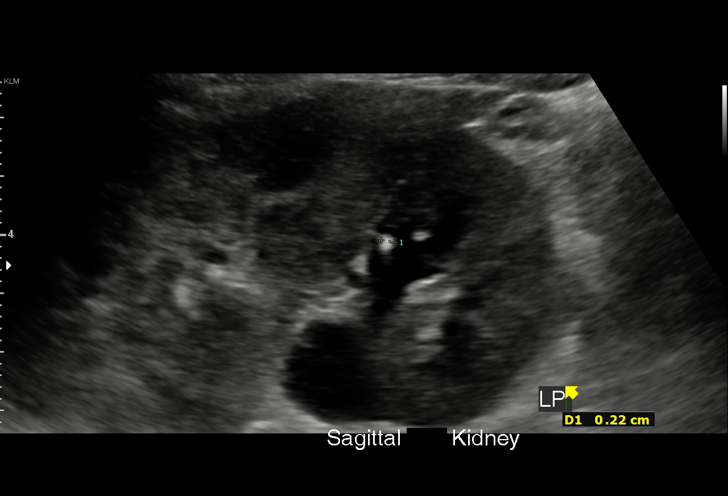
[im 38/38]
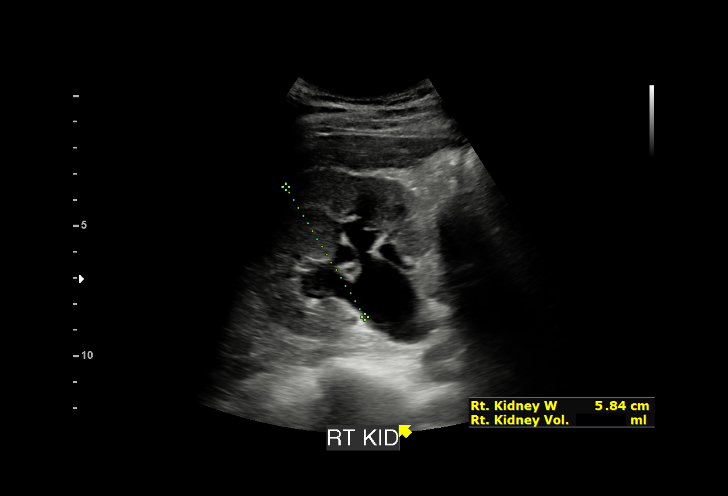

[15 of 25 positions shown; findings below may reference images not displayed]

FINDINGS: Right Kidney:

Renal measurements: 11.6 x 6.8 x 5.8 cm = volume: 239.1 mL. Normal
renal cortical echotexture. No renal mass. There is moderate right
hydronephrosis proximal right hydroureter, which could be due to
extrinsic compression from gravid uterus.

Left Kidney:

Renal measurements: 10.5 x 5.3 by 6.0 cm = volume: 172.5 mL. Normal
renal cortical echotexture. Mild left hydronephrosis and proximal
left hydroureter. This could be due to extrinsic compression from
the gravid uterus. 2 mm nonobstructing calculus lower pole left
kidney.

Bladder:

Appears normal for degree of bladder distention.

Other:

None.
IMPRESSION: 1. Bilateral hydronephrosis, right greater than left. This could be
due to extrinsic compression on the distal ureters by the gravid
uterus.
2. Nonobstructing 2 mm left renal calculus peer

## 2023-08-17 ENCOUNTER — Inpatient Hospital Stay (HOSPITAL_COMMUNITY): Admission: AD | Admit: 2023-08-17 | Source: Home / Self Care | Admitting: Obstetrics and Gynecology

## 2023-09-13 NOTE — L&D Delivery Note (Signed)
 Operative Note  PROCEDURE DATE: 07/30/24   PREOPERATIVE DIAGNOSIS: Nonreassuring fetal heart tracing, decreased fetal movement   POSTOPERATIVE DIAGNOSIS: The same, nuchal cord x 3   PROCEDURE:    Primary Low Transverse Cesarean Section   SURGEON:  Dr. Massie Smiles   INDICATIONS: This is a 23 y.o. yo G2P1001 at [redacted]w[redacted]d requiring cesarean section secondary to nonreassuring fetal heart tracing in the setting of decreased fetal movement.  She reports a significant change in fetal movement sensed throughout the day. Upon MAU monitoring, decelerations were noted and resolved with position changes.  Decelerations recurred, including a 3-4 minute prolonged deceleration. Moderate variability and multiple accelerations noted during MAU stay, but given intermittent NRFHT and decreased movement, decision was made to proceed with C-section.  The risks of cesarean section discussed with the patient included but were not limited to: bleeding which may require transfusion or reoperation; infection which may require antibiotics; injury to bowel, bladder, ureters or other surrounding organs; injury to the fetus; need for additional procedures including hysterectomy in the event of a life-threatening hemorrhage; placental abnormalities wth subsequent pregnancies, incisional problems, thromboembolic phenomenon and other postoperative/anesthesia complications. The patient agreed with the proposed plan, giving informed consent for the procedure.     FINDINGS:  Viable female infant in vertex presentation, tight nuchal cord x3 loops, APGARs 9, 9,  Weight pending, Amniotic fluid clear,  Intact placenta, three vessel cord.  Grossly normal uterus.  Cord arterial pH 7.19, CO2 60.  .   ANESTHESIA:    Epidural ESTIMATED BLOOD LOSS: 500 cc EBL, QBL pend.  SPECIMENS: Placenta for pathology, possible abruption COMPLICATIONS: None immediate    PROCEDURE IN DETAIL:  The patient received intravenous antibiotics (2g Ancef ) and had  sequential compression devices applied to her lower extremities while in the preoperative area.  She was then taken to the operating room where epidural anesthesia was dosed up to surgical level and was found to be adequate. She was then placed in a dorsal supine position with a leftward tilt, and prepped and draped in a sterile manner.  A foley catheter was placed into her bladder and attached to constant gravity.    FHT was attempted to be checked prior to abdominal prep, however these were concerning for intermittent bradycardia and decision was made to expedite delivery with iodine  splash prep.  After an adequate timeout was performed, a Pfannenstiel skin incision was made with scalpel and carried through to the underlying layer of fascia. The fascia was incised in the midline and this incision was extended bilaterally bluntly for speed. Kocher clamps were applied to the superior aspect of the fascial incision and the underlying rectus muscles were dissected off bluntly. A similar process was carried out on the inferior aspect of the facial incision. The rectus muscles were separated in the midline bluntly and the peritoneum was entered bluntly.  A transverse hysterotomy was made with a scalpel and extended bilaterally bluntly. The bladder blade was then removed. The infant was successfully delivered, and cord was clamped and cut and infant was handed over to awaiting neonatology team. Uterine massage was then administered and the placenta delivered intact with three-vessel cord. Cord gases collected. The uterus was cleared of clot and debris.  The hysterotomy was closed with 0 vicryl.  A second imbricating suture of 0-vicryl was used to reinforce the incision and aid in hemostasis.The fascia was closed with 0-Vicryl in a running fashion with good restoration of anatomy.  The subcutaneus tissue was irrigated and was reapproximated  using running plain gut.  The skin was closed with 4-0 Vicryl in a  subcuticular fashion.  All surgical site and was hemostatic at end of procedure without any further bleeding on exam.    Pt tolerated the procedure well. All sponge/lap/needle counts were correct  X 2. Pt taken to recovery room in stable condition.     Massie Smiles MD

## 2023-10-20 ENCOUNTER — Ambulatory Visit: Payer: Self-pay | Admitting: Family Medicine

## 2023-10-20 ENCOUNTER — Ambulatory Visit (INDEPENDENT_AMBULATORY_CARE_PROVIDER_SITE_OTHER): Payer: BC Managed Care – PPO | Admitting: Urgent Care

## 2023-10-20 VITALS — BP 108/74 | HR 90 | Temp 97.5°F | Wt 121.0 lb

## 2023-10-20 DIAGNOSIS — G43109 Migraine with aura, not intractable, without status migrainosus: Secondary | ICD-10-CM

## 2023-10-20 DIAGNOSIS — R42 Dizziness and giddiness: Secondary | ICD-10-CM

## 2023-10-20 LAB — CBC WITH DIFFERENTIAL/PLATELET
Basophils Absolute: 0 10*3/uL (ref 0.0–0.1)
Basophils Relative: 0.3 % (ref 0.0–3.0)
Eosinophils Absolute: 0.1 10*3/uL (ref 0.0–0.7)
Eosinophils Relative: 1.5 % (ref 0.0–5.0)
HCT: 41.6 % (ref 36.0–46.0)
Hemoglobin: 14.2 g/dL (ref 12.0–15.0)
Lymphocytes Relative: 35.6 % (ref 12.0–46.0)
Lymphs Abs: 3.2 10*3/uL (ref 0.7–4.0)
MCHC: 34.1 g/dL (ref 30.0–36.0)
MCV: 91.5 fL (ref 78.0–100.0)
Monocytes Absolute: 0.7 10*3/uL (ref 0.1–1.0)
Monocytes Relative: 7.7 % (ref 3.0–12.0)
Neutro Abs: 5 10*3/uL (ref 1.4–7.7)
Neutrophils Relative %: 54.9 % (ref 43.0–77.0)
Platelets: 267 10*3/uL (ref 150.0–400.0)
RBC: 4.55 Mil/uL (ref 3.87–5.11)
RDW: 12.2 % (ref 11.5–15.5)
WBC: 9.1 10*3/uL (ref 4.0–10.5)

## 2023-10-20 LAB — COMPREHENSIVE METABOLIC PANEL
ALT: 9 U/L (ref 0–35)
AST: 14 U/L (ref 0–37)
Albumin: 4.7 g/dL (ref 3.5–5.2)
Alkaline Phosphatase: 114 U/L (ref 39–117)
BUN: 8 mg/dL (ref 6–23)
CO2: 29 meq/L (ref 19–32)
Calcium: 9 mg/dL (ref 8.4–10.5)
Chloride: 102 meq/L (ref 96–112)
Creatinine, Ser: 0.72 mg/dL (ref 0.40–1.20)
GFR: 117.66 mL/min (ref >60.00)
Glucose, Bld: 96 mg/dL (ref 70–99)
Potassium: 4 meq/L (ref 3.5–5.1)
Sodium: 138 meq/L (ref 135–145)
Total Bilirubin: 0.3 mg/dL (ref 0.2–1.2)
Total Protein: 7.1 g/dL (ref 6.0–8.3)

## 2023-10-20 MED ORDER — RIZATRIPTAN BENZOATE 5 MG PO TBDP: 5.0000 mg | ORAL_TABLET | ORAL | 0 refills | Status: AC | PRN

## 2023-10-20 NOTE — Telephone Encounter (Signed)
 FYI  Please see below

## 2023-10-20 NOTE — Patient Instructions (Signed)
 Your symptoms sound most consistent with a migraine with aura.  Please take rizatriptan  as needed. Place one tablet under your tongue as needed for migraine-like symptoms. May repeat in 2 hours if needed. Do not exceed 4 tabs in a 24 hour period.   We drew labs today to ensure no other causes of your dizziness/ headache.

## 2023-10-20 NOTE — Progress Notes (Signed)
 New Patient Office Visit  Subjective:  Patient ID: Sydney Dunn, female    DOB: 16-Jul-2001  Age: 23 y.o. MRN: [id number]  CC:  Chief Complaint  Patient presents with   Dizziness    Dizziness and vision changes that started yesterday. She does have a family history of diabetes    HPI Sydney Dunn presents to discuss an episode of dizziness, headache and vision change that occurred yesterday.  Discussed the use of AI scribe software for clinical note transcription with the patient, who gave verbal consent to proceed.  History of Present Illness   Sydney Dunn is a 23 year old female with migraines who presents with dizziness and visual disturbances.  She experienced a sudden onset of dizziness and visual disturbances yesterday while shopping after a doctor's appointment. Her vision became blurry, only affecting the L eye, and she felt a cloudiness in her head. This was accompanied by difficulty focusing on her surroundings and performing tasks, such as managing her child in the store. She describes the episode as feeling 'very odd' and noted that her body felt fine otherwise. She contacted her husband for support and sat in her car to calm down, but the symptoms persisted, including a headache on the L side. She also experienced nausea, which led to a lack of appetite, although she did not vomit.  She has a history of migraines, which she describes as typically very severe, affecting her eyes and temples, but notes that this episode felt different from her usual migraines. She was previously prescribed sumatriptan  (Imitrex ) for migraines but has not needed it recently. (Last Rx was in 2020). She was active in sports and experienced migraines when not adequately hydrated.  She took a nap for about two hours yesterday and woke up feeling hungry but still nauseous, and she had to eat slowly to avoid worsening the nausea. She reports the dizzy sensation is nearly resolved, although  has a slight sensation of this, especially when bending over, and her vision is fully back to normal. No recent illness, fever, vomiting, diarrhea, or urinary symptoms. She is currently on Depo-Provera and experienced spotting and pain two weeks ago, coinciding with her last shot. She took a pregnancy test, which was negative. No recent changes in appetite or bowel habits. Pt denies DM, hx of blood clots, or recent illness. No GI sx. She does not smoke.      Outpatient Encounter Medications as of 10/20/2023  Medication Sig   fluticasone  (FLONASE ) 50 MCG/ACT nasal spray Place into the nose.   ibuprofen  (ADVIL ) 600 MG tablet Take 1 tablet (600 mg total) by mouth every 6 (six) hours.   medroxyPROGESTERone (DEPO-PROVERA) 150 MG/ML injection Inject 150 mg into the muscle every 3 (three) months.   prenatal vitamin w/FE, FA (PRENATAL 1 + 1) 27-1 MG TABS tablet Take 1 tablet by mouth daily at 12 noon.   rizatriptan  (MAXALT -MLT) 5 MG disintegrating tablet Take 1 tablet (5 mg total) by mouth as needed for migraine. May repeat in 2 hours if needed   No facility-administered encounter medications on file as of 10/20/2023.    Past Medical History:  Diagnosis Date   Bartholin cyst    I&D x 4, last time [facility] 06/04/22   Cholestasis    History of headache    History of hypotension    History of pyelonephritis    Migraine without aura    Tremor    mild physiologic tremor per neuro, no  need for f/u    Past Surgical History:  Procedure Laterality Date   BARTHOLIN CYST MARSUPIALIZATION N/A 07/28/2022   Procedure: BARTHOLIN CYST MARSUPIALIZATION;  Surgeon: Rendell Calton LABOR, DO;  Location: [facility];  Service: Gynecology;  Laterality: N/A;   FOOT SURGERY Left    HERNIA REPAIR     abdominal hernia 2010 at [facility]   TYMPANOSTOMY TUBE PLACEMENT     69 months of age    Family History  Problem Relation Age of Onset   Breast cancer Other        paternal great grandmother   Hyperlipidemia  Maternal Grandfather    Heart disease Maternal Grandfather    Hypertension Maternal Grandfather    Diabetes Maternal Grandfather    Hypertension Mother    Hypertension Father    Diabetes Father    Hypertension Maternal Grandmother    Diabetes Maternal Grandmother    Kidney disease Other        great grandfather    Social History   Socioeconomic History   Marital status: Married    Spouse name: Not on file   Number of children: 1   Years of education: Not on file   Highest education level: Associate degree: academic program  Occupational History   Not on file  Tobacco Use   Smoking status: Never   Smokeless tobacco: Never  Vaping Use   Vaping status: Never Used  Substance and Sexual Activity   Alcohol use: Yes    Comment: occasionally   Drug use: No   Sexual activity: Yes    Birth control/protection: Injection    Comment: depo every 3 months  Other Topics Concern   Not on file  Social History Narrative   Softball and basketball   [employer]   Social Drivers of Health   Financial Resource Strain: Low Risk  (10/20/2023)   Overall Financial Resource Strain (CARDIA)    Difficulty of Paying Living Expenses: Not very hard  Food Insecurity: No Food Insecurity (10/20/2023)   Hunger Vital Sign    Worried About Running Out of Food in the Last Year: Never true    Ran Out of Food in the Last Year: Never true  Transportation Needs: No Transportation Needs (10/20/2023)   PRAPARE - Administrator, Civil Service (Medical): No    Lack of Transportation (Non-Medical): No  Physical Activity: Insufficiently Active (10/20/2023)   Exercise Vital Sign    Days of Exercise per Week: 2 days    Minutes of Exercise per Session: 30 min  Stress: No Stress Concern Present (10/20/2023)   [employer] of Occupational Health - Occupational Stress Questionnaire    Feeling of Stress : Only a little  Social Connections: Moderately Integrated (10/20/2023)   Social  Connection and Isolation Panel [NHANES]    Frequency of Communication with Friends and Family: More than three times a week    Frequency of Social Gatherings with Friends and Family: Once a week    Attends Religious Services: More than 4 times per year    Active Member of [employer] or Organizations: No    Attends Banker Meetings: Not on file    Marital Status: Married  Intimate Partner Violence: Unknown (06/04/2022)   Received from [employer], [facility]   HITS    Physically Hurt: Not on file    Insult or Talk Down To: Not on file    Threaten Physical Harm: Not on file    Scream  or Curse: Not on file    ROS: as noted in HPI  Objective:  BP 108/74   Pulse 90   Temp (!) 97.5 F (36.4 C)   Wt 121 lb (54.9 kg)   SpO2 100%   Breastfeeding No   BMI 22.13 kg/m   Physical Exam Vitals and nursing note reviewed.  Constitutional:      General: She is not in acute distress.    Appearance: She is normal weight. She is not ill-appearing, toxic-appearing or diaphoretic.  HENT:     Head: Normocephalic and atraumatic.     Right Ear: External ear normal.     Left Ear: External ear normal.     Mouth/Throat:     Mouth: Mucous membranes are moist.     Pharynx: Oropharynx is clear. No oropharyngeal exudate or posterior oropharyngeal erythema.  Eyes:     General: No visual field deficit or scleral icterus.       Right eye: No discharge.        Left eye: No discharge.     Extraocular Movements: Extraocular movements intact.     Pupils: Pupils are equal, round, and reactive to light.  Cardiovascular:     Rate and Rhythm: Normal rate and regular rhythm.     Pulses: Normal pulses.     Heart sounds: Normal heart sounds. No murmur heard.    No friction rub. No gallop.  Pulmonary:     Effort: Pulmonary effort is normal. No respiratory distress.     Breath sounds: Normal breath sounds. No stridor. No wheezing, rhonchi or rales.  Chest:     Chest wall: No tenderness.   Abdominal:     General: Abdomen is flat. Bowel sounds are normal. There is no distension.     Palpations: Abdomen is soft. There is no mass.     Tenderness: There is no abdominal tenderness. There is no guarding or rebound.  Musculoskeletal:        General: No swelling or tenderness. Normal range of motion.     Cervical back: Normal range of motion and neck supple. No rigidity or tenderness.     Right lower leg: No edema.     Left lower leg: No edema.  Lymphadenopathy:     Cervical: No cervical adenopathy.  Skin:    General: Skin is warm and dry.     Coloration: Skin is not jaundiced.     Findings: No bruising, erythema or rash.  Neurological:     General: No focal deficit present.     Mental Status: She is alert and oriented to person, place, and time. Mental status is at baseline.     Cranial Nerves: Cranial nerves 2-12 are intact. No cranial nerve deficit, dysarthria or facial asymmetry.     Sensory: Sensation is intact. No sensory deficit.     Motor: Motor function is intact. No weakness, tremor, atrophy, abnormal muscle tone or seizure activity.     Coordination: Coordination is intact. Coordination normal. Finger-Nose-Finger Test and Heel to [facility] Test normal. Rapid alternating movements normal.     Deep Tendon Reflexes: Reflexes are normal and symmetric.  Psychiatric:        Mood and Affect: Mood normal.        Behavior: Behavior normal.     Last CBC Lab Results  Component Value Date   WBC 6.5 07/28/2022   HGB 12.5 07/28/2022   HCT 37.4 07/28/2022   MCV 92.8 07/28/2022   MCH 31.0 07/28/2022  RDW 12.0 07/28/2022   PLT 206 07/28/2022   Last metabolic panel Lab Results  Component Value Date   GLUCOSE 102 (H) 01/26/2022   NA 137 01/26/2022   K 4.6 01/26/2022   CL 108 01/26/2022   CO2 22 01/26/2022   BUN <5 (L) 01/26/2022   CREATININE 0.53 01/26/2022   GFRNONAA >60 01/26/2022   CALCIUM  8.4 (L) 01/26/2022   PROT 5.4 (L) 01/26/2022   ALBUMIN 2.2 (L) 01/26/2022    LABGLOB 2.5 07/04/2019   AGRATIO 1.8 07/04/2019   BILITOT 0.8 01/26/2022   ALKPHOS 181 (H) 01/26/2022   AST 13 (L) 01/26/2022   ALT 10 01/26/2022   ANIONGAP 7 01/26/2022   Last lipids Lab Results  Component Value Date   CHOL 206 (H) 07/04/2019   HDL 63 07/04/2019   LDLCALC 113 (H) 07/04/2019   TRIG 176 (H) 07/04/2019   CHOLHDL 3.3 07/04/2019   Last hemoglobin A1c Lab Results  Component Value Date   HGBA1C 5.3 07/04/2019      Assessment & Plan:  Dizziness -     CBC with Differential/Platelet -     Comprehensive metabolic panel  Migraine with aura and without status migrainosus, not intractable -     Rizatriptan  Benzoate; Take 1 tablet (5 mg total) by mouth as needed for migraine. May repeat in 2 hours if needed  Dispense: 10 tablet; Refill: 0  Assessment and Plan    Migraine with Aura Sudden onset of blurry vision in the left eye, headache, and dizziness. History of migraines but this episode was atypical. Neurological exam was normal. - Prescribe Rizatriptan  to be taken sublingually. Can repeat dose in 2 hours if needed. - Order CMP and CBC to rule out anemia and hyperglycemia as potential causes of symptoms. Results to be reviewed when available.  General Health Maintenance - Continue Depo-Provera for contraception. No need for pregnancy test as Depo-Provera was recently administered and pt had neg test yesterday at home.      Follow up with PCP should sx persist, recur or not respond to treatments offered today.  No follow-ups on file.   Benton LITTIE Gave, PA

## 2023-10-20 NOTE — Telephone Encounter (Signed)
 Chief Complaint: Dizziness spell Symptoms: dizziness, blurry vision, uncoordinated Frequency: yesterday Pertinent Negatives: Patient denies fever, numbness and tingling in arms or legs Disposition: [] ED /[] Urgent Care (no appt availability in office) / [x] Appointment(In office/virtual)/ []  [facility]/ [] Home Care/ [] Refused Recommended Disposition /[] [facility]/ []  Follow-up with PCP Additional Notes: Patient called in stating she experienced an episode yesterday that has never happened before. Patient was in the store with her daughter and pushing her daughter in buggy, and suddenly felt very dizzy, mild blurry/decreased vision/disoriented. Patient states she wasn't able to fully grasp on to object daughter was handing her. Patient had her husband pick her up and went home and stated she felt the urge to take a nap, slept about two hours, and still felt dizzy and abnormal vision when she woke up. Patient states she feels significantly better today but does not feel back to baseline. Patient states she is not a diabetic but has strong family hx of diabetes and can usually notice when glucose levels are off, and this specific episode felt different. Patient denies any history of seizures. Patient states her body felt normal in that moment, did not feel any numbness or tingling, but did feel faint at one point. Patient is leaving to [place]  on Sunday and would like further evaluation.    Copied from CRM [id number]. Topic: Clinical - Red Word Triage >> Oct 20, 2023 11:00 AM Mercedes MATSU wrote: Red Word that prompted transfer to Nurse Triage: Patient states that she has been having dizzy spells since yesterday since around 1pm. Patient is highly concerned and patient is requesting same day appointment. Reason for Disposition  [1] MODERATE dizziness (e.g., interferes with normal activities) AND [2] has NOT been evaluated by doctor (or NP/PA) for this  (Exception: Dizziness caused by heat  exposure, sudden standing, or poor fluid intake.)  Answer Assessment - Initial Assessment Questions 1. DESCRIPTION: Describe your dizziness.     Dizzy, couldn't see clear, felt faint 2. LIGHTHEADED: Do you feel lightheaded? (e.g., somewhat faint, woozy, weak upon standing)     Lightheaded, blurry vision 3. VERTIGO: Do you feel like either you or the room is spinning or tilting? (i.e. vertigo)     Both 4. SEVERITY: How bad is it?  Do you feel like you are going to faint? Can you stand and walk?   - MILD: Feels slightly dizzy, but walking normally.   - MODERATE: Feels unsteady when walking, but not falling; interferes with normal activities (e.g., school, work).   - SEVERE: Unable to walk without falling, or requires assistance to walk without falling; feels like passing out now.      Right now - normal 5. ONSET:  When did the dizziness begin?     Yesterday 6. AGGRAVATING FACTORS: Does anything make it worse? (e.g., standing, change in head position)     unsure 7. HEART RATE: Can you tell me your heart rate? How many beats in 15 seconds?  (Note: not all patients can do this)       Didn't feel like heart was racing 8. CAUSE: What do you think is causing the dizziness?     No 9. RECURRENT SYMPTOM: Have you had dizziness before? If Yes, ask: When was the last time? What happened that time?     Haven't had any issue like this before 10. OTHER SYMPTOMS: Do you have any other symptoms? (e.g., fever, chest pain, vomiting, diarrhea, bleeding)       nausea 11. PREGNANCY:  Is there any chance you are pregnant? When was your last menstrual period?       Negative pregnant test yesterday  Protocols used: Dizziness - Lightheadedness-A-AH

## 2024-07-30 ENCOUNTER — Encounter (HOSPITAL_COMMUNITY)
Admission: AD | Disposition: A | Discharge status: Home / Self Care | Payer: Self-pay | Source: Home / Self Care | Attending: Obstetrics and Gynecology

## 2024-07-30 ENCOUNTER — Inpatient Hospital Stay (HOSPITAL_COMMUNITY)
Admission: AD | Admit: 2024-07-30 | Discharge: 2024-08-01 | DRG: 788 | Disposition: A | Discharge status: Home / Self Care | Attending: Obstetrics and Gynecology | Admitting: Obstetrics and Gynecology

## 2024-07-30 ENCOUNTER — Inpatient Hospital Stay (HOSPITAL_COMMUNITY): Admitting: Certified Registered"

## 2024-07-30 ENCOUNTER — Other Ambulatory Visit: Payer: Self-pay

## 2024-07-30 ENCOUNTER — Encounter (HOSPITAL_COMMUNITY): Payer: Self-pay | Admitting: Obstetrics and Gynecology

## 2024-07-30 DIAGNOSIS — O368130 Decreased fetal movements, third trimester, not applicable or unspecified: Secondary | ICD-10-CM | POA: Diagnosis present

## 2024-07-30 DIAGNOSIS — Z3A37 37 weeks gestation of pregnancy: Secondary | ICD-10-CM | POA: Diagnosis not present

## 2024-07-30 DIAGNOSIS — Z833 Family history of diabetes mellitus: Secondary | ICD-10-CM

## 2024-07-30 DIAGNOSIS — Z3490 Encounter for supervision of normal pregnancy, unspecified, unspecified trimester: Secondary | ICD-10-CM

## 2024-07-30 DIAGNOSIS — Z8249 Family history of ischemic heart disease and other diseases of the circulatory system: Secondary | ICD-10-CM

## 2024-07-30 DIAGNOSIS — O368390 Maternal care for abnormalities of the fetal heart rate or rhythm, unspecified trimester, not applicable or unspecified: Principal | ICD-10-CM

## 2024-07-30 DIAGNOSIS — O4693 Antepartum hemorrhage, unspecified, third trimester: Secondary | ICD-10-CM

## 2024-07-30 LAB — COMPREHENSIVE METABOLIC PANEL WITH GFR
ALT: 8 U/L (ref 0–44)
AST: 15 U/L (ref 15–41)
Albumin: 3.3 g/dL — ABNORMAL LOW (ref 3.5–5.0)
Alkaline Phosphatase: 210 U/L — ABNORMAL HIGH (ref 38–126)
Anion gap: 12 (ref 5–15)
BUN: 5 mg/dL — ABNORMAL LOW (ref 6–20)
CO2: 21 mmol/L — ABNORMAL LOW (ref 22–32)
Calcium: 8.8 mg/dL — ABNORMAL LOW (ref 8.9–10.3)
Chloride: 104 mmol/L (ref 98–111)
Creatinine, Ser: 0.59 mg/dL (ref 0.44–1.00)
GFR, Estimated: >60 mL/min (ref >60)
Glucose, Bld: 88 mg/dL (ref 70–99)
Potassium: 3.3 mmol/L — ABNORMAL LOW (ref 3.5–5.1)
Sodium: 137 mmol/L (ref 135–145)
Total Bilirubin: 0.5 mg/dL (ref 0.0–1.2)
Total Protein: 6.7 g/dL (ref 6.5–8.1)

## 2024-07-30 LAB — DIC (DISSEMINATED INTRAVASCULAR COAGULATION)PANEL
D-Dimer, Quant: 1.33 ug{FEU}/mL — ABNORMAL HIGH (ref 0.00–0.50)
Fibrinogen: 395 mg/dL (ref 210–475)
INR: 1 (ref 0.8–1.2)
Platelets: 271 K/uL (ref 150–400)
Prothrombin Time: 14.2 s (ref 11.4–15.2)
Smear Review: NONE SEEN
aPTT: 28 s (ref 24–36)

## 2024-07-30 LAB — CBC
HCT: 37.2 % (ref 36.0–46.0)
Hemoglobin: 12.5 g/dL (ref 12.0–15.0)
MCH: 30.9 pg (ref 26.0–34.0)
MCHC: 33.6 g/dL (ref 30.0–36.0)
MCV: 92.1 fL (ref 80.0–100.0)
Platelets: 285 K/uL (ref 150–400)
RBC: 4.04 MIL/uL (ref 3.87–5.11)
RDW: 12.7 % (ref 11.5–15.5)
WBC: 15.8 K/uL — ABNORMAL HIGH (ref 4.0–10.5)
nRBC: 0 % (ref 0.0–0.2)

## 2024-07-30 LAB — TYPE AND SCREEN
ABO/RH(D): O NEG
Antibody Screen: POSITIVE

## 2024-07-30 SURGERY — Surgical Case
Anesthesia: Spinal

## 2024-07-30 MED ORDER — DIBUCAINE (PERIANAL) 1 % EX OINT
1.0000 | TOPICAL_OINTMENT | CUTANEOUS | Status: DC | PRN
Start: 2024-07-30 — End: 2024-08-01

## 2024-07-30 MED ORDER — LACTATED RINGERS IV SOLN: INTRAVENOUS | Status: DC

## 2024-07-30 MED ORDER — SIMETHICONE 80 MG PO CHEW
80.0000 mg | CHEWABLE_TABLET | Freq: Three times a day (TID) | ORAL | Status: DC
  Administered 2024-07-31 – 2024-08-01 (×5): 80 mg via ORAL
  Filled 2024-07-30 (×5): qty 1

## 2024-07-30 MED ORDER — ONDANSETRON HCL 4 MG PO TABS
4.0000 mg | ORAL_TABLET | Freq: Three times a day (TID) | ORAL | Status: DC | PRN
  Administered 2024-07-31: 4 mg via ORAL
  Filled 2024-07-30: qty 1

## 2024-07-30 MED ORDER — ONDANSETRON HCL 4 MG/2ML IJ SOLN
INTRAMUSCULAR | Status: DC | PRN
  Administered 2024-07-30: 4 mg via INTRAVENOUS

## 2024-07-30 MED ORDER — DIPHENHYDRAMINE HCL 25 MG PO CAPS: 25.0000 mg | ORAL_CAPSULE | Freq: Four times a day (QID) | ORAL | Status: DC | PRN

## 2024-07-30 MED ORDER — FENTANYL CITRATE (PF) 100 MCG/2ML IJ SOLN
INTRAMUSCULAR | Status: DC | PRN
  Administered 2024-07-30: 15 ug via INTRATHECAL

## 2024-07-30 MED ORDER — LACTATED RINGERS IV SOLN: INTRAVENOUS | Status: DC | PRN

## 2024-07-30 MED ORDER — PHENYLEPHRINE HCL-NACL 20-0.9 MG/250ML-% IV SOLN
INTRAVENOUS | Status: DC | PRN
  Administered 2024-07-30: 100 ug/min via INTRAVENOUS

## 2024-07-30 MED ORDER — PHENYLEPHRINE 80 MCG/ML (10ML) SYRINGE FOR IV PUSH (FOR BLOOD PRESSURE SUPPORT)
PREFILLED_SYRINGE | INTRAVENOUS | Status: DC | PRN
  Administered 2024-07-30 (×4): 160 ug via INTRAVENOUS

## 2024-07-30 MED ORDER — ZOLPIDEM TARTRATE 5 MG PO TABS: 5.0000 mg | ORAL_TABLET | Freq: Every evening | ORAL | Status: DC | PRN

## 2024-07-30 MED ORDER — OXYTOCIN-SODIUM CHLORIDE 30-0.9 UT/500ML-% IV SOLN
INTRAVENOUS | Status: AC
Start: 2024-07-30 — End: 2024-07-30
  Filled 2024-07-30: qty 500

## 2024-07-30 MED ORDER — PROMETHAZINE (PHENERGAN) 6.25MG IN NS 50ML IVPB: 6.2500 mg | INTRAVENOUS | Status: DC | PRN

## 2024-07-30 MED ORDER — ACETAMINOPHEN 10 MG/ML IV SOLN
INTRAVENOUS | Status: DC | PRN
  Administered 2024-07-30: 1000 mg via INTRAVENOUS

## 2024-07-30 MED ORDER — SIMETHICONE 80 MG PO CHEW: 80.0000 mg | CHEWABLE_TABLET | ORAL | Status: DC | PRN

## 2024-07-30 MED ORDER — CEFAZOLIN SODIUM-DEXTROSE 2-4 GM/100ML-% IV SOLN
2.0000 g | INTRAVENOUS | Status: AC
  Administered 2024-07-30: 2 g via INTRAVENOUS

## 2024-07-30 MED ORDER — DEXAMETHASONE SOD PHOSPHATE PF 10 MG/ML IJ SOLN
INTRAMUSCULAR | Status: DC | PRN
  Administered 2024-07-30: 10 mg via INTRAVENOUS

## 2024-07-30 MED ORDER — IBUPROFEN 600 MG PO TABS
600.0000 mg | ORAL_TABLET | Freq: Four times a day (QID) | ORAL | Status: DC
  Administered 2024-07-31 – 2024-08-01 (×7): 600 mg via ORAL
  Filled 2024-07-30 (×7): qty 1

## 2024-07-30 MED ORDER — KETOROLAC TROMETHAMINE 30 MG/ML IJ SOLN
30.0000 mg | Freq: Once | INTRAMUSCULAR | Status: AC | PRN
  Administered 2024-07-30: 30 mg via INTRAVENOUS

## 2024-07-30 MED ORDER — ACETAMINOPHEN 325 MG PO TABS
650.0000 mg | ORAL_TABLET | ORAL | Status: DC | PRN
  Administered 2024-07-31 – 2024-08-01 (×5): 650 mg via ORAL
  Filled 2024-07-30 (×5): qty 2

## 2024-07-30 MED ORDER — MORPHINE SULFATE (PF) 0.5 MG/ML IJ SOLN
INTRAMUSCULAR | Status: AC
  Filled 2024-07-30: qty 10

## 2024-07-30 MED ORDER — WITCH HAZEL-GLYCERIN EX PADS: 1.0000 | MEDICATED_PAD | CUTANEOUS | Status: DC | PRN

## 2024-07-30 MED ORDER — ONDANSETRON HCL 4 MG/2ML IJ SOLN
INTRAMUSCULAR | Status: AC
  Filled 2024-07-30: qty 2

## 2024-07-30 MED ORDER — SOD CITRATE-CITRIC ACID 500-334 MG/5ML PO SOLN
30.0000 mL | Freq: Once | ORAL | Status: AC
  Administered 2024-07-30: 30 mL via ORAL

## 2024-07-30 MED ORDER — PRENATAL MULTIVITAMIN CH
1.0000 | ORAL_TABLET | Freq: Every day | ORAL | Status: DC
  Administered 2024-07-31 – 2024-08-01 (×2): 1 via ORAL
  Filled 2024-07-30 (×2): qty 1

## 2024-07-30 MED ORDER — TERBUTALINE SULFATE 1 MG/ML IJ SOLN
INTRAMUSCULAR | Status: AC
  Administered 2024-07-30: 1 mg
  Filled 2024-07-30: qty 1

## 2024-07-30 MED ORDER — SODIUM CHLORIDE 0.9 % IV SOLN: 500.0000 mg | INTRAVENOUS | Status: DC

## 2024-07-30 MED ORDER — FENTANYL CITRATE (PF) 100 MCG/2ML IJ SOLN: 25.0000 ug | INTRAMUSCULAR | Status: DC | PRN

## 2024-07-30 MED ORDER — OXYTOCIN-SODIUM CHLORIDE 30-0.9 UT/500ML-% IV SOLN: 2.5000 [IU]/h | INTRAVENOUS | Status: AC

## 2024-07-30 MED ORDER — ACETAMINOPHEN 10 MG/ML IV SOLN
INTRAVENOUS | Status: AC
  Filled 2024-07-30: qty 100

## 2024-07-30 MED ORDER — FENTANYL CITRATE (PF) 100 MCG/2ML IJ SOLN
INTRAMUSCULAR | Status: AC
  Filled 2024-07-30: qty 2

## 2024-07-30 MED ORDER — SENNOSIDES-DOCUSATE SODIUM 8.6-50 MG PO TABS
2.0000 | ORAL_TABLET | Freq: Every day | ORAL | Status: DC
  Administered 2024-07-31 – 2024-08-01 (×2): 2 via ORAL
  Filled 2024-07-30 (×2): qty 2

## 2024-07-30 MED ORDER — SOD CITRATE-CITRIC ACID 500-334 MG/5ML PO SOLN: 30.0000 mL | ORAL | Status: DC

## 2024-07-30 MED ORDER — OXYCODONE HCL 5 MG PO TABS
5.0000 mg | ORAL_TABLET | ORAL | Status: DC | PRN
  Administered 2024-07-31 – 2024-08-01 (×3): 5 mg via ORAL
  Filled 2024-07-30 (×3): qty 1

## 2024-07-30 MED ORDER — LACTATED RINGERS IV BOLUS
1000.0000 mL | Freq: Once | INTRAVENOUS | Status: AC
  Administered 2024-07-30: 1000 mL via INTRAVENOUS

## 2024-07-30 MED ORDER — COCONUT OIL OIL: 1.0000 | TOPICAL_OIL | Status: DC | PRN

## 2024-07-30 MED ORDER — KETOROLAC TROMETHAMINE 30 MG/ML IJ SOLN
INTRAMUSCULAR | Status: AC
  Filled 2024-07-30: qty 1

## 2024-07-30 MED ORDER — MORPHINE SULFATE (PF) 0.5 MG/ML IJ SOLN
INTRAMUSCULAR | Status: DC | PRN
  Administered 2024-07-30: 150 ug via INTRATHECAL

## 2024-07-30 MED ORDER — BUPIVACAINE IN DEXTROSE 0.75-8.25 % IT SOLN
INTRATHECAL | Status: DC | PRN
  Administered 2024-07-30: 1.6 mL via INTRATHECAL

## 2024-07-30 MED ORDER — EPHEDRINE SULFATE (PRESSORS) 25 MG/5ML IV SOSY
PREFILLED_SYRINGE | INTRAVENOUS | Status: DC | PRN
  Administered 2024-07-30: 20 mg via INTRAVENOUS

## 2024-07-30 MED ORDER — MENTHOL 3 MG MT LOZG: 1.0000 | LOZENGE | OROMUCOSAL | Status: DC | PRN

## 2024-07-30 MED ORDER — PHENYLEPHRINE HCL-NACL 20-0.9 MG/250ML-% IV SOLN
INTRAVENOUS | Status: AC
Start: 2024-07-30 — End: 2024-07-30
  Filled 2024-07-30: qty 250

## 2024-07-30 SURGICAL SUPPLY — 30 items
BENZOIN TINCTURE PRP APPL 2/3 (GAUZE/BANDAGES/DRESSINGS) IMPLANT
CHLORAPREP W/TINT 26 (MISCELLANEOUS) ×2 IMPLANT
CLAMP UMBILICAL CORD (MISCELLANEOUS) ×1 IMPLANT
CLOTH BEACON ORANGE TIMEOUT ST (SAFETY) ×1 IMPLANT
CLSR STERI-STRIP ANTIMIC 1/2X4 (GAUZE/BANDAGES/DRESSINGS) IMPLANT
DERMABOND ADVANCED .7 DNX12 (GAUZE/BANDAGES/DRESSINGS) IMPLANT
DRSG OPSITE POSTOP 4X10 (GAUZE/BANDAGES/DRESSINGS) ×1 IMPLANT
ELECTRODE REM PT RTRN 9FT ADLT (ELECTROSURGICAL) ×1 IMPLANT
EXTRACTOR VACUUM M CUP 4 TUBE (SUCTIONS) IMPLANT
GLOVE BIOGEL PI IND STRL 7.0 (GLOVE) ×2 IMPLANT
GLOVE BIOGEL PI IND STRL 7.5 (GLOVE) ×1 IMPLANT
GLOVE ECLIPSE 7.0 STRL STRAW (GLOVE) ×1 IMPLANT
GOWN STRL REUS W/TWL LRG LVL3 (GOWN DISPOSABLE) ×2 IMPLANT
KIT ABG SYR 3ML LUER SLIP (SYRINGE) IMPLANT
MAT PREVALON FULL STRYKER (MISCELLANEOUS) IMPLANT
NDL HYPO 25X5/8 SAFETYGLIDE (NEEDLE) IMPLANT
NEEDLE HYPO 25X5/8 SAFETYGLIDE (NEEDLE) IMPLANT
NS IRRIG 1000ML POUR BTL (IV SOLUTION) ×1 IMPLANT
PACK C SECTION WH (CUSTOM PROCEDURE TRAY) ×1 IMPLANT
PAD OB MATERNITY 4.3X12.25 (PERSONAL CARE ITEMS) ×1 IMPLANT
STRIP CLOSURE SKIN 1/2X4 (GAUZE/BANDAGES/DRESSINGS) IMPLANT
SUT PDS AB 0 CTX 60 (SUTURE) ×2 IMPLANT
SUT PLAIN 0 NONE (SUTURE) IMPLANT
SUT PLAIN ABS 2-0 CT1 27XMFL (SUTURE) IMPLANT
SUT VIC AB 0 CT1 36 (SUTURE) IMPLANT
SUT VIC AB 0 CTX36XBRD ANBCTRL (SUTURE) ×3 IMPLANT
SUT VIC AB 4-0 KS 27 (SUTURE) ×1 IMPLANT
TOWEL OR 17X24 6PK STRL BLUE (TOWEL DISPOSABLE) ×1 IMPLANT
TRAY FOLEY W/BAG SLVR 14FR LF (SET/KITS/TRAYS/PACK) ×1 IMPLANT
WATER STERILE IRR 1000ML POUR (IV SOLUTION) ×1 IMPLANT

## 2024-07-30 NOTE — Anesthesia Postprocedure Evaluation (Signed)
 Anesthesia Post Note  Patient: Sydney Dunn  Procedure(s) Performed: CESAREAN DELIVERY     Patient location during evaluation: PACU Anesthesia Type: Spinal Level of consciousness: awake Pain management: pain level controlled Vital Signs Assessment: post-procedure vital signs reviewed and stable Respiratory status: spontaneous breathing, respiratory function stable and nonlabored ventilation Cardiovascular status: blood pressure returned to baseline and stable Postop Assessment: no headache, no backache and no apparent nausea or vomiting Anesthetic complications: no   No notable events documented.  Last Vitals:  Vitals:   07/30/24 2202 07/30/24 2309  BP: 98/61 119/65  Pulse: 85 78  Resp: 18 17  Temp: 36.7 C   SpO2: 99% 98%    Last Pain:  Vitals:   07/30/24 2205  TempSrc:   PainSc: 2                  [name] Arch

## 2024-07-30 NOTE — MAU Provider Note (Signed)
 Chief Complaint:  Decreased Fetal Movement and Vaginal Bleeding   HPI      Sydney Dunn is a 23 y.o. G2P1001 at [redacted]w[redacted]d who presents to maternity admissions reporting decreased fetal movement and vaginal bleeding that started today.   Pregnancy Course: [facility]   Past Medical History:  Diagnosis Date   Bartholin cyst    I&D x 4, last time [facility] 06/04/22   Cholestasis (HCC)    History of headache    History of hypotension    History of pyelonephritis    Migraine without aura    Tremor    mild physiologic tremor per neuro, no need for f/u   OB History  Gravida Para Term Preterm AB Living  2 1 1  0 0 1  SAB IAB Ectopic Multiple Live Births  0 0 0 0 1    # Outcome Date GA Lbr Len/2nd Weight Sex Type Anes PTL Lv  2 Current           1 Term 02/06/22 [redacted]w[redacted]d / 00:36 2700 g F Vag-Spont EPI  LIV     Birth Comments: WDL   Past Surgical History:  Procedure Laterality Date   BARTHOLIN CYST MARSUPIALIZATION N/A 07/28/2022   Procedure: BARTHOLIN CYST MARSUPIALIZATION;  Surgeon: Rendell Calton LABOR, DO;  Location: [facility];  Service: Gynecology;  Laterality: N/A;   FOOT SURGERY Left    HERNIA REPAIR     abdominal hernia 2010 at [facility]   TYMPANOSTOMY TUBE PLACEMENT     34 months of age   Family History  Problem Relation Age of Onset   Breast cancer Other        paternal great grandmother   Hyperlipidemia Maternal Grandfather    Heart disease Maternal Grandfather    Hypertension Maternal Grandfather    Diabetes Maternal Grandfather    Hypertension Mother    Hypertension Father    Diabetes Father    Hypertension Maternal Grandmother    Diabetes Maternal Grandmother    Kidney disease Other        great grandfather   Social History   Tobacco Use   Smoking status: Never   Smokeless tobacco: Never  Vaping Use   Vaping status: Never Used  Substance Use Topics   Alcohol use: Yes    Comment: occasionally   Drug use: No   No Known  Allergies Medications Prior to Admission  Medication Sig Dispense Refill Last Dose/Taking   prenatal vitamin w/FE, FA (PRENATAL 1 + 1) 27-1 MG TABS tablet Take 1 tablet by mouth daily at 12 noon.   07/29/2024   fluticasone  (FLONASE ) 50 MCG/ACT nasal spray Place into the nose.   Unknown   ibuprofen  (ADVIL ) 600 MG tablet Take 1 tablet (600 mg total) by mouth every 6 (six) hours. 30 tablet 0 Unknown   medroxyPROGESTERone (DEPO-PROVERA) 150 MG/ML injection Inject 150 mg into the muscle every 3 (three) months.   Unknown   rizatriptan  (MAXALT -MLT) 5 MG disintegrating tablet Take 1 tablet (5 mg total) by mouth as needed for migraine. May repeat in 2 hours if needed 10 tablet 0     I have reviewed patient's Past Medical Hx, Surgical Hx, Family Hx, Social Hx, medications and allergies.   ROS  Pertinent items noted in HPI and remainder of comprehensive ROS otherwise negative.   PHYSICAL EXAM  Patient Vitals for the past 24 hrs:  BP Temp Temp src Pulse Resp SpO2 Weight  07/30/24 1918 -- -- -- -- --  100 % 60.8 kg  07/30/24 1834 106/62 98.2 F (36.8 C) Oral 64 16 100 % --    Constitutional: Well-developed, thin  female in no acute distress.  Cardiovascular: normal rate & rhythm, warm and well-perfused Respiratory: normal effort, no problems with respiration noted GI: Abdomen gravid with contractions palpable approximately every 1 to 2 minutes MS: Extremities nontender, no edema, normal ROM Neurologic: Alert and oriented x 4.  Pelvic: Sterile's vaginal exam chaperoned by Kaylan  Cowher, RN SVE 1 to 2 cm with blood visualized on glove after exam    Initially when patient was placed on fetal monitor difficulty recording fetal heart rate therefore called to the bedside by RN.  I performed a vaginal exam and an ultrasound was performed at bedside to confirm fetal heart rate.  Tetanic contractions were palpated and fetal heart rate with decelerations noted.  I immediately called Dr. Izell [facility] MAU  Attending) and Dr. Lequita [facility] private attending regarding nonreassuring tracing and vaginal bleeding at 37 weeks and 4 days with concern for placental abruption.   IVF Bolus DIC Labs Positional Changes  OR notified   Dr Lequita at the bedside at the bedside at [facility] and at 1857 a spontaneous deceleration noted with tetanic contraction.  I was at the bedside and ordered terbutaline  0.25 mg SQ  and requested Dr. Lequita to the bedside and C-section was called at that time`by Dr Lequita [facility] Private Attending)  Fetal Tracing: Nonreassuring fetal heart rate tracing with tetanic contractions and decelerations noted with contractions   Labs: Results for orders placed or performed during the hospital encounter of 07/30/24 (from the past 24 hours)  Type and screen [facility]     Status: None   Collection Time: 07/30/24  6:01 PM  Result Value Ref Range   ABO/RH(D) O NEG    Antibody Screen POS    Sample Expiration      08/02/2024,2359 Performed at [facility], [address], [place], [place] [address]   DIC Panel ONCE - STAT     Status: Abnormal (Preliminary result)   Collection Time: 07/30/24  6:32 PM  Result Value Ref Range   Prothrombin Time 14.2 11.4 - 15.2 seconds   INR 1.0 0.8 - 1.2   aPTT 28 24 - 36 seconds   Fibrinogen 395 210 - 475 mg/dL   D-Dimer, Quant 8.66 (H) 0.00 - 0.50 ug/mL-FEU   Platelets 271 150 - 400 K/uL   Smear Review PENDING   CBC     Status: Abnormal   Collection Time: 07/30/24  6:32 PM  Result Value Ref Range   WBC 15.8 (H) 4.0 - 10.5 K/uL   RBC 4.04 3.87 - 5.11 MIL/uL   Hemoglobin 12.5 12.0 - 15.0 g/dL   HCT 62.7 63.9 - 53.9 %   MCV 92.1 80.0 - 100.0 fL   MCH 30.9 26.0 - 34.0 pg   MCHC 33.6 30.0 - 36.0 g/dL   RDW 87.2 88.4 - 84.4 %   Platelets 285 150 - 400 K/uL   nRBC 0.0 0.0 - 0.2 %  Comprehensive metabolic panel     Status: Abnormal   Collection Time: 07/30/24  6:32 PM  Result Value Ref Range   Sodium 137 135 - 145 mmol/L    Potassium 3.3 (L) 3.5 - 5.1 mmol/L   Chloride 104 98 - 111 mmol/L   CO2 21 (L) 22 - 32 mmol/L   Glucose, Bld 88 70 - 99 mg/dL   BUN  5 (L) 6 - 20 mg/dL   Creatinine, Ser 9.40 0.44 - 1.00 mg/dL   Calcium  8.8 (L) 8.9 - 10.3 mg/dL   Total Protein 6.7 6.5 - 8.1 g/dL   Albumin 3.3 (L) 3.5 - 5.0 g/dL   AST 15 15 - 41 U/L   ALT 8 0 - 44 U/L   Alkaline Phosphatase 210 (H) 38 - 126 U/L   Total Bilirubin 0.5 0.0 - 1.2 mg/dL   GFR, Estimated >39 >39 mL/min   Anion gap 12 5 - 15    Imaging:  No results found.  MDM & MAU COURSE  MDM:  HIGH-> Admit   MAU Course: Orders Placed This Encounter  Procedures   DIC Panel ONCE - STAT   CBC   Comprehensive metabolic panel   Diet NPO time specified   Place and maintain sequential compression device   Informed Consent Details: Physician/Practitioner Attestation; Transcribe to consent form and obtain patient signature   Initiate Pre-op Protocol   Verify informed consent   Place and maintain sequential compression device   Instruct patient regarding incentive spirometry   Clip operative site   Labs per anesthesia   Type and screen [facility]   Insert peripheral IV   Admit to Inpatient (patient's expected length of stay will be greater than 2 midnights or inpatient only procedure)   Meds ordered this encounter  Medications   lactated ringers  bolus 1,000 mL   terbutaline  (BRETHINE ) 1 MG/ML injection    Barham, Jo R: cabinet override   sodium citrate-citric acid  (ORACIT) solution 30 mL   azithromycin  (ZITHROMAX ) 500 mg in sodium chloride  0.9 % 250 mL IVPB    Indication::   Surgical Prophylaxis   sodium citrate-citric acid  (ORACIT) solution 30 mL   ceFAZolin  (ANCEF ) IVPB 2g/100 mL premix    Indication::   Surgical Prophylaxis      I have reviewed the patient chart and performed the physical exam . I have ordered & interpreted the lab results and reviewed and interpreted the NST    ASSESSMENT   1. Antepartum  non-reassuring fetal heart rate or rhythm affecting care of mother   2. Vaginal bleeding in pregnancy, third trimester     PLAN  Admit to OR for C- Section  Dr Lequita at the bedside and OR aware   ----------------------------------------------------- Olam Dalton, MSN, [facility], [facility] for [facility]    This chart was dictated using voice recognition software, Dragon. Despite the best efforts of this provider to proofread and correct errors, errors may still occur which can change documentation meaning.

## 2024-07-30 NOTE — Lactation Note (Signed)
 This note was copied from a baby's chart. Lactation Consultation Note  Patient Name: Sydney Dunn Date: 07/30/2024 Age:24 hours Reason for consult: Initial assessment;Early term 37-38.6wks;Infant < 6lbs.  P2, ETI female infant. Per MOB she attempted latch infant prior to [facility] entering the room. FOB was holding infant. MOB already knows how to hand express, MOB self expressed 3 mls of colostrum that was spoon fed to infant. MOB will continue to breastfeed infant by cues, 8+ times within 24 hours, skin to skin. MOB knows if infant does not latch to the breast on Day 1 she will continue to hand express and give infant colostrum by spoon. MOB knows to call if she needs latch assistance. MOB is experienced with breastfeeding see maternal data below. LC discussed importance of maternal rest, meals and hydration.   Maternal Data Has patient been taught Hand Expression?: Yes Does the patient have breastfeeding experience prior to this delivery?: Yes How long did the patient breastfeed?: Per MOB, she breastfeed her 1st child for 9 months.  Feeding Mother's Current Feeding Choice: Breast Milk and Formula  LATCH Score Latch: Grasps breast easily, tongue down, lips flanged, rhythmical sucking.  Audible Swallowing: A few with stimulation  Type of Nipple: Everted at rest and after stimulation  Comfort (Breast/Nipple): Soft / non-tender  Hold (Positioning): Assistance needed to correctly position infant at breast and maintain latch.  LATCH Score: 8   Lactation Tools Discussed/Used    Interventions Interventions: Breast feeding basics reviewed;Skin to skin;Hand express;Expressed milk;Education;Guidelines for Milk Supply and Pumping Schedule Handout;LC Services brochure;CDC milk storage guidelines;CDC Guidelines for Breast Pump Cleaning  Discharge Pump: Personal;Hands Free;DEBP (Per MOB, she has a hand pump, DEBP plug in and Hands free DEBP at home.)  Consult Status Consult Status:  Follow-up Date: 07/31/24 Follow-up type: In-patient    Sydney Dunn 07/30/2024, 11:44 PM

## 2024-07-30 NOTE — MAU Note (Signed)
 Sydney Dunn is a 23 y.o. at 101w4d here in MAU reporting: unsure the last time she felt fetal movement. Having vaginal spotting since Sunday. Last intercourse was Saturday night. Denies any CTXS or LOF.  EFM placed on abdomen and FHR of 105-110 noted. Patient turned to right side with no improvement. Patient turned to left side with no improvement. Sydney Lukes NP at bedside with bedside US . IV started and LR bolus initiated. Dr. Lequita notified.   Pain score: 0 FHT:105 improved to 140 with interventions Lab orders placed from triage:

## 2024-07-30 NOTE — H&P (Addendum)
 OB History and Physical   Sydney Dunn is a 23 y.o. female G2P1001 presenting for decreased fetal movement at [redacted]w[redacted]d.  She reports decreased movement throughout the day today. Felt 1 kick while in MAU.   She reports some pressure and discomfort, but is overall comfortable. She denies vaginal bleeding or contractions, but has had small spotting since Sunday.   Initially upon arrival to MAU, FHR decelerations were noted that quickly resolved with position changes.   Pregnancy has been uncomplicated. Prior pregnancy was complicated by ICP.  Rh negative, GBS negative, panorama low risk.    OB History     Gravida  2   Para  1   Term  1   Preterm  0   AB  0   Living  1      SAB  0   IAB  0   Ectopic  0   Multiple  0   Live Births  1          Past Medical History:  Diagnosis Date   Bartholin cyst    I&D x 4, last time [facility] 06/04/22   Cholestasis (HCC)    History of headache    History of hypotension    History of pyelonephritis    Migraine without aura    Tremor    mild physiologic tremor per neuro, no need for f/u   Past Surgical History:  Procedure Laterality Date   BARTHOLIN CYST MARSUPIALIZATION N/A 07/28/2022   Procedure: BARTHOLIN CYST MARSUPIALIZATION;  Surgeon: Rendell Calton LABOR, DO;  Location: [facility];  Service: Gynecology;  Laterality: N/A;   FOOT SURGERY Left    HERNIA REPAIR     abdominal hernia 2010 at [facility]   TYMPANOSTOMY TUBE PLACEMENT     79 months of age   Family History: family history includes Breast cancer in an other family member; Diabetes in her father, maternal grandfather, and maternal grandmother; Heart disease in her maternal grandfather; Hyperlipidemia in her maternal grandfather; Hypertension in her father, maternal grandfather, maternal grandmother, and mother; Kidney disease in an other family member. Social History:  reports that she has never smoked. She has never used smokeless tobacco. She reports  current alcohol use. She reports that she does not use drugs.     Maternal Diabetes: No Genetic Screening: Normal Maternal Ultrasounds/Referrals: Normal Fetal Ultrasounds or other Referrals:  None Maternal Substance Abuse:  No Significant Maternal Medications:  None Significant Maternal Lab Results:  Group B Strep negative Other Comments:  None  Review of Systems - Patient denies fever, chills, SOB, CP, N/V/D.  History   Blood pressure 106/62, pulse 64, temperature 98.2 F (36.8 C), temperature source Oral, resp. rate 16, SpO2 100%. Exam Physical Exam   Gen: alert, well appearing, no distress Chest: nonlabored breathing CV: no peripheral edema Abdomen: gravid, firm Ext: no evidence of DVT  Prenatal labs: ABO, Rh: --/--/PENDING (11/18 1801) Antibody: PENDING (11/18 1801) Rubella:   RPR:    HBsAg:    HIV:    GBS:     Assessment/Plan: Admit to Labor and Delivery After initial decelerations on arrival, FHT recovered to moderate variability and with accels present.  We planned for and discussed prolonged monitoring in MAU, however contraction activity started on Toco and recurrent late and variable decels were noted, including 3-4 min prolonged.  This was responsive to hands and knees positioning and terbutaline  x1. Discussed NRFHT, possible abruption, recommend urgent PLTCS. Discussed risks, which include but are not  limited to bleeding, infection, damage to nearby organs. She accepts blood transfusion. FHT at the moment recovered with moderate variability, but concern for developing process and fetal distress, especially in the setting of DFM. Will check FHT in OR and consider spinal anesthesia if able.   Sydney Dunn 07/30/2024, 7:10 PM

## 2024-07-30 NOTE — Transfer of Care (Signed)
 Immediate Anesthesia Transfer of Care Note  Patient: Sydney Dunn  Procedure(s) Performed: CESAREAN DELIVERY  Patient Location: PACU  Anesthesia Type:Spinal  Level of Consciousness: awake, alert , oriented, and patient cooperative  Airway & Oxygen Therapy: Patient Spontanous Breathing  Post-op Assessment: Report given to RN and Post -op Vital signs reviewed and stable  Post vital signs: Reviewed and stable  Last Vitals:  Vitals Value Taken Time  BP 107/46 07/30/24 20:48  Temp    Pulse 84 07/30/24 20:57  Resp 16 07/30/24 20:57  SpO2 100 % 07/30/24 20:57  Vitals shown include unfiled device data.  Last Pain:  Vitals:   07/30/24 1834  TempSrc: Oral         Complications: No notable events documented.

## 2024-07-30 NOTE — Op Note (Signed)
 Operative Note  PROCEDURE DATE: 07/30/24   PREOPERATIVE DIAGNOSIS: Nonreassuring fetal heart tracing, decreased fetal movement   POSTOPERATIVE DIAGNOSIS: The same, nuchal cord x 3   PROCEDURE:    Primary Low Transverse Cesarean Section   SURGEON:  Dr. Massie Smiles   INDICATIONS: This is a 23 y.o. yo G2P1001 at [redacted]w[redacted]d requiring cesarean section secondary to nonreassuring fetal heart tracing in the setting of decreased fetal movement.  She reports a significant change in fetal movement sensed throughout the day. Upon MAU monitoring, decelerations were noted and resolved with position changes.  Decelerations recurred, including a 3-4 minute prolonged deceleration. Moderate variability and multiple accelerations noted during MAU stay, but given intermittent NRFHT and decreased movement, decision was made to proceed with C-section.  The risks of cesarean section discussed with the patient included but were not limited to: bleeding which may require transfusion or reoperation; infection which may require antibiotics; injury to bowel, bladder, ureters or other surrounding organs; injury to the fetus; need for additional procedures including hysterectomy in the event of a life-threatening hemorrhage; placental abnormalities wth subsequent pregnancies, incisional problems, thromboembolic phenomenon and other postoperative/anesthesia complications. The patient agreed with the proposed plan, giving informed consent for the procedure.     FINDINGS:  Viable female infant in vertex presentation, tight nuchal cord x3 loops, APGARs 9, 9,  Weight pending, Amniotic fluid clear,  Intact placenta, three vessel cord.  Grossly normal uterus.  Cord arterial pH 7.19, CO2 60.  .   ANESTHESIA:    Epidural ESTIMATED BLOOD LOSS: 500 cc EBL, QBL pend.  SPECIMENS: Placenta for pathology, possible abruption COMPLICATIONS: None immediate    PROCEDURE IN DETAIL:  The patient received intravenous antibiotics (2g Ancef ) and had  sequential compression devices applied to her lower extremities while in the preoperative area.  She was then taken to the operating room where epidural anesthesia was dosed up to surgical level and was found to be adequate. She was then placed in a dorsal supine position with a leftward tilt, and prepped and draped in a sterile manner.  A foley catheter was placed into her bladder and attached to constant gravity.    FHT was attempted to be checked prior to abdominal prep, however these were concerning for intermittent bradycardia and decision was made to expedite delivery with iodine  splash prep.  After an adequate timeout was performed, a Pfannenstiel skin incision was made with scalpel and carried through to the underlying layer of fascia. The fascia was incised in the midline and this incision was extended bilaterally bluntly for speed. Kocher clamps were applied to the superior aspect of the fascial incision and the underlying rectus muscles were dissected off bluntly. A similar process was carried out on the inferior aspect of the facial incision. The rectus muscles were separated in the midline bluntly and the peritoneum was entered bluntly.  A transverse hysterotomy was made with a scalpel and extended bilaterally bluntly. The bladder blade was then removed. The infant was successfully delivered, and cord was clamped and cut and infant was handed over to awaiting neonatology team. Uterine massage was then administered and the placenta delivered intact with three-vessel cord. Cord gases collected. The uterus was cleared of clot and debris.  The hysterotomy was closed with 0 vicryl.  A second imbricating suture of 0-vicryl was used to reinforce the incision and aid in hemostasis.The fascia was closed with 0-Vicryl in a running fashion with good restoration of anatomy.  The subcutaneus tissue was irrigated and was reapproximated  using running plain gut.  The skin was closed with 4-0 Vicryl in a  subcuticular fashion.  All surgical site and was hemostatic at end of procedure without any further bleeding on exam.    Pt tolerated the procedure well. All sponge/lap/needle counts were correct  X 2. Pt taken to recovery room in stable condition.     Massie Smiles MD

## 2024-07-30 NOTE — Anesthesia Procedure Notes (Signed)
 Spinal  Patient location during procedure: OR Start time: 07/30/2024 7:27 PM End time: 07/30/2024 7:30 PM Reason for block: surgical anesthesia Staffing Performed: anesthesiologist  Anesthesiologist: Peggye Delon Brunswick, MD Performed by: Peggye Delon Brunswick, MD Authorized by: Peggye Delon Brunswick, MD   Preanesthetic Checklist Completed: patient identified, IV checked, site marked, risks and benefits discussed, surgical consent, monitors and equipment checked, pre-op evaluation and timeout performed Spinal Block Patient position: sitting Prep: DuraPrep Patient monitoring: blood pressure and continuous pulse ox Approach: midline Location: L3-4 Injection technique: single-shot Needle Needle type: Pencan  Needle gauge: 24 G Needle length: 9 cm Assessment Sensory level: T4 Additional Notes Risks and benefits of neuraxial anesthesia including, but not limited to, infection, bleeding, local anesthetic toxicity, headache, hypotension, back pain, block failure, etc. were discussed with the patient. The patient expressed understanding and consented to the procedure. I confirmed that the patient has no bleeding disorders and is not taking blood thinners. I confirmed the patient's last platelet count with the nurse. Monitors were applied. A time-out was performed immediately prior to the procedure. Sterile technique was used throughout the whole procedure.   1 attempt(s)

## 2024-07-30 NOTE — Anesthesia Preprocedure Evaluation (Signed)
 Anesthesia Evaluation  Patient identified by MRN, date of birth, ID band Patient awake    Reviewed: Allergy & Precautions, NPO status , Patient's Chart, lab work & pertinent test results  History of Anesthesia Complications Negative for: history of anesthetic complications  Airway Mallampati: I  TM Distance: >3 FB Neck ROM: Full    Dental  (+) Teeth Intact   Pulmonary neg pulmonary ROS   Pulmonary exam normal breath sounds clear to auscultation       Cardiovascular negative cardio ROS  Rhythm:Regular Rate:Normal     Neuro/Psych  Headaches, neg Seizures Mild physiologic tremor    GI/Hepatic negative GI ROS, Neg liver ROS,,,  Endo/Other  negative endocrine ROS    Renal/GU negative Renal ROS     Musculoskeletal   Abdominal   Peds  Hematology negative hematology ROS (+) Lab Results      Component                Value               Date                      WBC                      15.8 (H)            07/30/2024                HGB                      12.5                07/30/2024                HCT                      37.2                07/30/2024                MCV                      92.1                07/30/2024                PLT                      271                 07/30/2024                PLT                      285                 07/30/2024              Anesthesia Other Findings   Reproductive/Obstetrics (+) Pregnancy                              Anesthesia Physical Anesthesia Plan  ASA: 2  Anesthesia Plan: Spinal   Post-op Pain Management:    Induction:   PONV Risk Score and Plan: 2 and Ondansetron , Dexamethasone  and Treatment may vary due to age  or medical condition  Airway Management Planned: Natural Airway  Additional Equipment:   Intra-op Plan:   Post-operative Plan:   Informed Consent: I have reviewed the patients History and Physical, chart,  labs and discussed the procedure including the risks, benefits and alternatives for the proposed anesthesia with the patient or authorized representative who has indicated his/her understanding and acceptance.     Dental advisory given  Plan Discussed with: CRNA and Anesthesiologist  Anesthesia Plan Comments: (I confirmed with Dr. Lequita that case is urgent and not emergent and that there is time for spinal anesthesia. Patient last ate a sub at 1:00 pm and drank at 6:00 pm.   I have discussed risks of neuraxial anesthesia including but not limited to infection, bleeding, nerve injury, back pain, headache, seizures, and failure of block. Patient denies bleeding disorders and is not currently anticoagulated. Labs have been reviewed. Risks and benefits discussed. All patient's questions answered.  )         Anesthesia Quick Evaluation

## 2024-07-31 ENCOUNTER — Encounter (HOSPITAL_COMMUNITY): Payer: Self-pay | Admitting: Obstetrics and Gynecology

## 2024-07-31 LAB — CBC
HCT: 27.7 % — ABNORMAL LOW (ref 36.0–46.0)
Hemoglobin: 9.2 g/dL — ABNORMAL LOW (ref 12.0–15.0)
MCH: 31 pg (ref 26.0–34.0)
MCHC: 33.2 g/dL (ref 30.0–36.0)
MCV: 93.3 fL (ref 80.0–100.0)
Platelets: 263 K/uL (ref 150–400)
RBC: 2.97 MIL/uL — ABNORMAL LOW (ref 3.87–5.11)
RDW: 12.7 % (ref 11.5–15.5)
WBC: 25.3 K/uL — ABNORMAL HIGH (ref 4.0–10.5)
nRBC: 0 % (ref 0.0–0.2)

## 2024-07-31 MED ORDER — RHO D IMMUNE GLOBULIN 1500 UNIT/2ML IJ SOSY
300.0000 ug | PREFILLED_SYRINGE | Freq: Once | INTRAMUSCULAR | Status: AC
  Administered 2024-07-31: 300 ug via INTRAVENOUS
  Filled 2024-07-31: qty 2

## 2024-07-31 MED ORDER — FAMOTIDINE 20 MG PO TABS: 20.0000 mg | ORAL_TABLET | Freq: Every evening | ORAL | Status: DC | PRN

## 2024-07-31 MED ORDER — DIPHENHYDRAMINE HCL 50 MG/ML IJ SOLN: 12.5000 mg | Freq: Four times a day (QID) | INTRAMUSCULAR | Status: DC | PRN

## 2024-07-31 MED ORDER — SODIUM CHLORIDE 0.9% FLUSH: 3.0000 mL | INTRAVENOUS | Status: DC | PRN

## 2024-07-31 MED ORDER — NALOXONE HCL 0.4 MG/ML IJ SOLN
0.4000 mg | INTRAMUSCULAR | Status: DC | PRN
Start: 2024-07-31 — End: 2024-08-01

## 2024-07-31 MED ORDER — KETOROLAC TROMETHAMINE 30 MG/ML IJ SOLN: 30.0000 mg | Freq: Four times a day (QID) | INTRAMUSCULAR | Status: DC | PRN

## 2024-07-31 MED ORDER — KETOROLAC TROMETHAMINE 30 MG/ML IJ SOLN
30.0000 mg | Freq: Four times a day (QID) | INTRAMUSCULAR | Status: DC | PRN
Start: 2024-07-31 — End: 2024-07-31

## 2024-07-31 MED ORDER — DIPHENHYDRAMINE HCL 25 MG PO CAPS
25.0000 mg | ORAL_CAPSULE | Freq: Four times a day (QID) | ORAL | Status: DC | PRN
Start: 2024-07-31 — End: 2024-08-01

## 2024-07-31 MED ORDER — ONDANSETRON HCL 4 MG/2ML IJ SOLN
4.0000 mg | Freq: Three times a day (TID) | INTRAMUSCULAR | Status: DC | PRN
Start: 2024-07-31 — End: 2024-08-01

## 2024-07-31 MED ORDER — ACETAMINOPHEN 500 MG PO TABS: 1000.0000 mg | ORAL_TABLET | Freq: Four times a day (QID) | ORAL | Status: DC

## 2024-07-31 NOTE — Lactation Note (Signed)
 Lactation Consultation Note  Patient Name: Sydney Dunn Unijb'd Date: 07/31/2024 Age:23 y.o. Reason for consult: Follow-up assessment;Early term 37-38.6wks;Infant < 6lbs  P2. Experienced BF mom stated her 1st child was less than 6 lbs as well. Mom BF for 9 months. Mom stated this baby has been BF well. Mom denies any painful latches. Encouraged mom to baby's cheeks to breast and flange lips more to open little mouth more so he doesn't just suckle on the nipples. Placed pillow under arm for support. Baby BF well. Discussed not feeding longer than 15 min. To conserve energy. Also discussed supplementing since baby now 5.9 lbs mom is fine with that. LC set up DEBP.  Mom shown how to use DEBP & how to disassemble, clean, & reassemble parts. Mom has Medela at home. Mom encouraged to feed baby 8-12 times/24 hours and with feeding cues.  If baby hasn't cued in 3 hrs wake baby for feeding. Mom stated the baby has been doing some cluster feeding at times all ready. LC gave My Care Plan for feeding supplement and LPI since 37 and SGA. Mom didn't have any questions or concerns. Encouraged mom to call if needs assistance. Newborn feeding habits, behavior, STS, I&O, supplementing, milk storage, support w/pillows reviewed. Praised mom for feeding so well. Maternal Data Has patient been taught Hand Expression?: Yes Does the patient have breastfeeding experience prior to this delivery?: Yes How long did the patient breastfeed?: 9 months to her now 83 1/24 yr old  Feeding    LATCH Score Latch: Grasps breast easily, tongue down, lips flanged, rhythmical sucking.  Audible Swallowing: Spontaneous and intermittent  Type of Nipple: Everted at rest and after stimulation  Comfort (Breast/Nipple): Soft / non-tender  Hold (Positioning): No assistance needed to correctly position infant at breast.  LATCH Score: 10   Lactation Tools Discussed/Used Tools: Pump;Flanges Flange Size: 18 Breast pump type:  Double-Electric Breast Pump Pump Education: Setup, frequency, and cleaning;Milk Storage Reason for Pumping: less than 6 lbs Pumping frequency: q 3hr  Interventions Interventions: Breast feeding basics reviewed;Assisted with latch;Skin to skin;Support pillows;DEBP;Education;LC Services brochure;LPT handout/interventions;Infant Driven Feeding Algorithm education  Discharge Pump: DEBP (medela/mom cozie)  Consult Status Consult Status: Follow-up Date: 08/01/24 Follow-up type: In-patient    Winferd Wease G 07/31/2024, 8:39 PM

## 2024-07-31 NOTE — Lactation Note (Signed)
 This note was copied from a baby's chart. Lactation Consultation Note  Patient Name: Boy Payten Hobin Unijb'd Date: 07/31/2024 Age:24 hours Reason for consult: Follow-up assessment;Early term 37-38.6wks;Infant < 6lbs  P2. See mom note from mom's chart.  Maternal Data Does the patient have breastfeeding experience prior to this delivery?: Yes How long did the patient breastfeed?: 9 months  Feeding    LATCH Score Latch: Grasps breast easily, tongue down, lips flanged, rhythmical sucking.  Audible Swallowing: Spontaneous and intermittent  Type of Nipple: Everted at rest and after stimulation  Comfort (Breast/Nipple): Soft / non-tender  Hold (Positioning): No assistance needed to correctly position infant at breast.  LATCH Score: 10   Lactation Tools Discussed/Used Tools: Pump;Flanges Flange Size: 18 Breast pump type: Double-Electric Breast Pump Pump Education: Setup, frequency, and cleaning;Milk Storage Reason for Pumping: less than 6 lbs Pumping frequency: q 3hr  Interventions Interventions: Breast feeding basics reviewed;Skin to skin;Support pillows;DEBP;Education;LPT handout/interventions;Infant Driven Feeding Algorithm education  Discharge Pump: DEBP (medela/mom cozie)  Consult Status Consult Status: Follow-up Date: 08/01/24 Follow-up type: In-patient    Lestat Golob G 07/31/2024, 8:51 PM

## 2024-07-31 NOTE — Progress Notes (Signed)
 Postpartum Progress Note  S: No complaints. Feeling well. Lochia appropriate. No subjective fevers/chills, Cp, or SOB. Ambulating.   O:     07/31/2024    7:54 AM 07/30/2024   11:09 PM 07/30/2024   10:02 PM  Vitals with BMI  Systolic 102 119 98  Diastolic 68 65 61  Pulse 56 78 85    Gen: NAD, A&O Pulm: NWOB Abd: soft, appropriately ttp, fundus firm and below Umb. Incision c/d covered by honeycomb. Ext: No evidence of DVT  UOP adequate.   Labs Recent Results (from the past 2160 hours)  Type and screen [facility]     Status: None   Collection Time: 07/30/24  6:01 PM  Result Value Ref Range   ABO/RH(D) O NEG    Antibody Screen POS    Sample Expiration 08/02/2024,2359    Antibody Identification      PASSIVELY ACQUIRED ANTI-D Performed at [facility], [address], [place], [place] [address]   DIC Panel ONCE - STAT     Status: Abnormal   Collection Time: 07/30/24  6:32 PM  Result Value Ref Range   Prothrombin Time 14.2 11.4 - 15.2 seconds   INR 1.0 0.8 - 1.2    Comment: (NOTE) INR goal varies based on device and disease states.    aPTT 28 24 - 36 seconds   Fibrinogen 395 210 - 475 mg/dL    Comment: (NOTE) Fibrinogen results may be underestimated in patients receiving thrombolytic therapy.    D-Dimer, Quant 1.33 (H) 0.00 - 0.50 ug/mL-FEU    Comment: (NOTE) At the manufacturer cut-off value of 0.5 g/mL FEU, this assay has a negative predictive value of 95-100%.This assay is intended for use in conjunction with a clinical pretest probability (PTP) assessment model to exclude pulmonary embolism (PE) and deep venous thrombosis (DVT) in outpatients suspected of PE or DVT. Results should be correlated with clinical presentation.    Platelets 271 150 - 400 K/uL   Smear Review NO SCHISTOCYTES SEEN     Comment: Performed at [facility], [address], [place], [place] [address]  CBC     Status: Abnormal   Collection Time: 07/30/24   6:32 PM  Result Value Ref Range   WBC 15.8 (H) 4.0 - 10.5 K/uL   RBC 4.04 3.87 - 5.11 MIL/uL   Hemoglobin 12.5 12.0 - 15.0 g/dL   HCT 62.7 63.9 - 53.9 %   MCV 92.1 80.0 - 100.0 fL   MCH 30.9 26.0 - 34.0 pg   MCHC 33.6 30.0 - 36.0 g/dL   RDW 87.2 88.4 - 84.4 %   Platelets 285 150 - 400 K/uL   nRBC 0.0 0.0 - 0.2 %    Comment: Performed at [facility], [address], [place], [place] [address]  Comprehensive metabolic panel     Status: Abnormal   Collection Time: 07/30/24  6:32 PM  Result Value Ref Range   Sodium 137 135 - 145 mmol/L   Potassium 3.3 (L) 3.5 - 5.1 mmol/L   Chloride 104 98 - 111 mmol/L   CO2 21 (L) 22 - 32 mmol/L   Glucose, Bld 88 70 - 99 mg/dL    Comment: Glucose reference range applies only to samples taken after fasting for at least 8 hours.   BUN 5 (L) 6 - 20 mg/dL   Creatinine, Ser 9.40 0.44 - 1.00 mg/dL   Calcium  8.8 (L) 8.9 - 10.3 mg/dL   Total  Protein 6.7 6.5 - 8.1 g/dL   Albumin 3.3 (L) 3.5 - 5.0 g/dL   AST 15 15 - 41 U/L   ALT 8 0 - 44 U/L   Alkaline Phosphatase 210 (H) 38 - 126 U/L   Total Bilirubin 0.5 0.0 - 1.2 mg/dL   GFR, Estimated >39 >39 mL/min    Comment: (NOTE) Calculated using the CKD-EPI Creatinine Equation (2021)    Anion gap 12 5 - 15    Comment: Performed at [facility], [address] [place], [place] [address]  CBC     Status: Abnormal   Collection Time: 07/31/24  5:05 AM  Result Value Ref Range   WBC 25.3 (H) 4.0 - 10.5 K/uL   RBC 2.97 (L) 3.87 - 5.11 MIL/uL   Hemoglobin 9.2 (L) 12.0 - 15.0 g/dL    Comment:  DELTA CHECK NOTED   HCT 27.7 (L) 36.0 - 46.0 %   MCV 93.3 80.0 - 100.0 fL   MCH 31.0 26.0 - 34.0 pg   MCHC 33.2 30.0 - 36.0 g/dL   RDW 87.2 88.4 - 84.4 %   Platelets 263 150 - 400 K/uL   nRBC 0.0 0.0 - 0.2 %    Comment: Performed at [facility], [address], [place], [place] [address]     A/P:  POD1 s/p pCS, doing well pp. AFVSS. Benign exam. Desires circ - we reviewed r/b. Would like prior to  discharge, wants to wait. Has not yet been cleared by peds. Continue present care.   Slater Door, MD

## 2024-08-01 ENCOUNTER — Encounter (HOSPITAL_COMMUNITY): Payer: Self-pay | Admitting: Obstetrics and Gynecology

## 2024-08-01 LAB — SURGICAL PATHOLOGY

## 2024-08-01 LAB — RH IG WORKUP (INCLUDES ABO/RH)
Fetal Screen: NEGATIVE
Gestational Age(Wks): 37.4
Unit division: 0

## 2024-08-01 LAB — RPR: RPR Ser Ql: NONREACTIVE

## 2024-08-01 MED ORDER — IBUPROFEN 600 MG PO TABS: 600.0000 mg | ORAL_TABLET | Freq: Four times a day (QID) | ORAL | 0 refills | Status: AC

## 2024-08-01 MED ORDER — OXYCODONE HCL 5 MG PO TABS: 5.0000 mg | ORAL_TABLET | ORAL | 0 refills | Status: AC | PRN

## 2024-08-01 NOTE — Lactation Note (Signed)
 This note was copied from a baby's chart. Lactation Consultation Note  Patient Name: Sydney Dunn Unijb'd Date: 08/01/2024 Age:24 hours  Reason for consult: Follow-up assessment;Early term 37-38.6wks;Infant < 6lbs  P2, [redacted]w[redacted]d, 4% weight loss  Mother receptive to [facility] visit. She reports baby [name] is breastfeeding well. She recently pumped 20 ml of colostrum and getting ready to feed to baby by bottle. Mother reports experience with breastfeeding her last baby, who was also an early term infant. She reports having a good milk supply, pumping in the first few weeks, and supplementing with her EBM when needed.   Mother denies any questions or concerns. Aware of OP lactation services and support groups. D/C pending.    Feeding Mother's Current Feeding Choice: Breast Milk and Formula   Lactation Tools Discussed/Used Reason for Pumping: ETI, less than 6 lbs Pumping frequency: every 3 hrs to supplement with mother's milk Pumped volume: 20 mL  Interventions Interventions: Breast feeding basics reviewed  Discharge Discharge Education: Engorgement and breast care;Warning signs for feeding baby Pump: Manual;Hands Free;DEBP;Personal  Consult Status Consult Status: Complete Date: 08/01/24 Follow-up type: In-patient    Joshua Line M 08/01/2024, 5:20 PM

## 2024-08-01 NOTE — Discharge Summary (Signed)
 Postpartum Discharge Summary  Patient Name: Sydney Dunn DOB: 01/29/2001 MRN: [id number]  Date of admission: 07/30/2024 Delivery date:07/30/2024 Delivering provider: LEQUITA LYE A Date of discharge: 08/01/2024  Admitting diagnosis: Pregnancy [Z34.90] Delivery of pregnancy by cesarean section [O82] Intrauterine pregnancy: [redacted]w[redacted]d     Secondary diagnosis:  Principal Problem:   Pregnancy Active Problems:   Delivery of pregnancy by cesarean section  Additional problems: decreased fetal movement and non-reassuring fetal monitoring    Discharge diagnosis: Term Pregnancy Delivered                                              Post partum procedures:rhogam Augmentation: N/A Complications: None  Hospital course: Sceduled C/S   23 y.o. yo G2P2002 at [redacted]w[redacted]d was admitted to the hospital 07/30/2024 for scheduled cesarean section with the following indication:Non-Reassuring FHR.Delivery details are as follows:  Membrane Rupture Time/Date: 7:44 PM,07/30/2024  Delivery Method:C-Section, Low Transverse Operative Delivery:N/A Details of operation can be found in separate operative note.  Patient had a postpartum course complicated by n/a.  She is ambulating, tolerating a regular diet, passing flatus, and urinating well. Patient is discharged home in stable condition on  08/01/24        Newborn Data: Birth date:07/30/2024 Birth time:7:45 PM Gender:Female Living status:Living Apgars:9 ,9  Weight:2590 g    Magnesium Sulfate received: No BMZ received: No Rhophylac:Yes MMR:No T-DaP:Given prenatally Flu: No RSV Vaccine received: No Transfusion:No  Immunizations received: Immunization History  Administered Date(s) Administered   HPV 9-valent 05/06/2015   HPV Quadrivalent 03/11/2014, 04/24/2014   Influenza,inj,Quad PF,6+ Mos 05/18/2016, 06/16/2017, 11/22/2018, 10/03/2019   Meningococcal Conjugate 05/18/2016   PFIZER(Purple Top)SARS-COV-2 Vaccination 04/02/2020, 04/30/2020   PPD  Test 01/23/2019   Tdap 01/03/2011    Physical exam  Vitals:   07/31/24 0754 07/31/24 1202 07/31/24 2053 08/01/24 0624  BP: 102/68 106/66 115/63 (!) 94/57  Pulse: (!) 56 62 69 66  Resp: 17 17 18 16   Temp: 97.6 F (36.4 C) (!) 97.5 F (36.4 C) 98 F (36.7 C) 97.8 F (36.6 C)  TempSrc: Oral Oral Oral Oral  SpO2: 100% 99% 98% 100%  Weight:       General: alert, cooperative, and no distress Lochia: appropriate Uterine Fundus: firm Incision: Healing well with no significant drainage, No significant erythema, Dressing is clean, dry, and intact DVT Evaluation: No evidence of DVT seen on physical exam. Negative Homan's sign. No cords or calf tenderness. Labs: Lab Results  Component Value Date   WBC 25.3 (H) 07/31/2024   HGB 9.2 (L) 07/31/2024   HCT 27.7 (L) 07/31/2024   MCV 93.3 07/31/2024   PLT 263 07/31/2024      Latest Ref Rng & Units 07/30/2024    6:32 PM  CMP  Glucose 70 - 99 mg/dL 88   BUN 6 - 20 mg/dL 5   Creatinine 9.55 - 8.99 mg/dL 9.40   Sodium 864 - 854 mmol/L 137   Potassium 3.5 - 5.1 mmol/L 3.3   Chloride 98 - 111 mmol/L 104   CO2 22 - 32 mmol/L 21   Calcium  8.9 - 10.3 mg/dL 8.8   Total Protein 6.5 - 8.1 g/dL 6.7   Total Bilirubin 0.0 - 1.2 mg/dL 0.5   Alkaline Phos 38 - 126 U/L 210   AST 15 - 41 U/L 15   ALT 0 - 44 U/L 8  Edinburgh Score:    08/01/2024    1:14 AM  Edinburgh Postnatal Depression Scale Screening Tool  I have been able to laugh and see the funny side of things. 0  I have looked forward with enjoyment to things. 0  I have blamed myself unnecessarily when things went wrong. 1  I have been anxious or worried for no good reason. 2  I have felt scared or panicky for no good reason. 0  Things have been getting on top of me. 1  I have been so unhappy that I have had difficulty sleeping. 0  I have felt sad or miserable. 0  I have been so unhappy that I have been crying. 0  The thought of harming myself has occurred to me. 0  Edinburgh  Postnatal Depression Scale Total 4   Edinburgh Postnatal Depression Scale Total: 4   After visit meds:  Allergies as of 08/01/2024   No Known Allergies      Medication List     STOP taking these medications    medroxyPROGESTERone 150 MG/ML injection Commonly known as: DEPO-PROVERA       TAKE these medications    fluticasone  50 MCG/ACT nasal spray Commonly known as: FLONASE  Place into the nose.   ibuprofen  600 MG tablet Commonly known as: ADVIL  Take 1 tablet (600 mg total) by mouth every 6 (six) hours. What changed: when to take this   oxyCODONE  5 MG immediate release tablet Commonly known as: Oxy IR/ROXICODONE  Take 1 tablet (5 mg total) by mouth every 4 (four) hours as needed for up to 7 days for moderate pain (pain score 4-6).   prenatal vitamin w/FE, FA 27-1 MG Tabs tablet Take 1 tablet by mouth daily at 12 noon.   rizatriptan  5 MG disintegrating tablet Commonly known as: MAXALT -MLT Take 1 tablet (5 mg total) by mouth as needed for migraine. May repeat in 2 hours if needed         Discharge home in stable condition Infant Feeding: Bottle and Breast Infant Disposition:home with mother Discharge instruction: per After Visit Summary and Postpartum booklet. Activity: Advance as tolerated. Pelvic rest for 6 weeks.  Diet: routine diet Future Appointments:No future appointments. Follow up Visit: 6 weeks PPV  08/01/2024 Duwaine Blumenthal, DO

## 2024-08-01 NOTE — Discharge Instructions (Signed)
Call MD for T>100.4, heavy vaginal bleeding, severe abdominal pain, intractable nausea and/or vomiting, or respiratory distress.  Call office to schedule postpartum visit in 6 weeks.  Pelvic rest x 6 weeks.  No driving while taking narcotics.  No heavy lifting.   

## 2024-08-01 NOTE — Progress Notes (Signed)
 Subjective: Postpartum Day 2: Cesarean Delivery Patient reports tolerating PO, + flatus, and no problems voiding.  Requests circ.  Objective: Vital signs in last 24 hours: Temp:  [97.5 F (36.4 C)-98 F (36.7 C)] 97.8 F (36.6 C) (11/20 0624) Pulse Rate:  [62-69] 66 (11/20 0624) Resp:  [16-18] 16 (11/20 0624) BP: (94-115)/(57-66) 94/57 (11/20 0624) SpO2:  [98 %-100 %] 100 % (11/20 9375)  Physical Exam:  General: alert, cooperative, and appears stated age 23: appropriate Uterine Fundus: firm Incision: healing well, no significant drainage, no dehiscence DVT Evaluation: No evidence of DVT seen on physical exam. Negative Homan's sign. No cords or calf tenderness.  Recent Labs    07/30/24 1832 07/31/24 0505  HGB 12.5 9.2*  HCT 37.2 27.7*    Assessment/Plan: Status post Cesarean section. Doing well postoperatively.  Circ-patient is counseled re: risk of bleeding, infection, and scarring.  All questions were answered and patient wishes to proceed. Discharge home with standard precautions and return to clinic in 4-6 weeks.  Duwaine Blumenthal, DO 08/01/2024, 8:56 AM

## 2024-08-12 ENCOUNTER — Telehealth (HOSPITAL_COMMUNITY): Payer: Self-pay | Admitting: *Deleted

## 2024-08-12 NOTE — Telephone Encounter (Signed)
 08/12/2024  Name: Sydney Dunn MRN: [id number] DOB: 26-Jul-2001  Reason for Call:  Transition of Care Hospital Discharge Call  Contact Status: Patient Contact Status: Message  Language assistant needed:          Follow-Up Questions:    Van Postnatal Depression Scale:  In the Past 7 Days:    PHQ2-9 Depression Scale:     Discharge Follow-up:    Post-discharge interventions: NA  Mliss Sieve, RN 08/12/2024 15:08

## 2024-08-16 ENCOUNTER — Inpatient Hospital Stay (HOSPITAL_COMMUNITY): Admit: 2024-08-16 | Admitting: Obstetrics and Gynecology
